# Patient Record
Sex: Male | Born: 1965 | Race: White | Hispanic: No | Marital: Married | Smoking: Current every day smoker
Health system: Southern US, Community
[De-identification: ages and names within clinical notes are randomized; demographics above are authoritative.]

## PROBLEM LIST (undated history)

## (undated) DIAGNOSIS — I10 Essential (primary) hypertension: Secondary | ICD-10-CM

## (undated) DIAGNOSIS — I509 Heart failure, unspecified: Secondary | ICD-10-CM

## (undated) DIAGNOSIS — N289 Disorder of kidney and ureter, unspecified: Secondary | ICD-10-CM

## (undated) HISTORY — PX: TONSILLECTOMY: SUR1361

## (undated) HISTORY — DX: Heart failure, unspecified: I50.9

## (undated) HISTORY — DX: Essential (primary) hypertension: I10

---

## 1998-05-25 ENCOUNTER — Encounter: Admission: RE | Admit: 1998-05-25 | Discharge: 1998-05-25 | Payer: Self-pay | Admitting: Family Medicine

## 1998-05-30 ENCOUNTER — Encounter: Admission: RE | Admit: 1998-05-30 | Discharge: 1998-05-30 | Payer: Self-pay | Admitting: Family Medicine

## 2009-09-16 ENCOUNTER — Emergency Department (HOSPITAL_COMMUNITY): Admission: EM | Admit: 2009-09-16 | Discharge: 2009-09-17 | Payer: Self-pay | Admitting: Emergency Medicine

## 2011-03-29 LAB — BASIC METABOLIC PANEL
CO2: 27 mEq/L (ref 19–32)
Calcium: 9.7 mg/dL (ref 8.4–10.5)
Chloride: 104 mEq/L (ref 96–112)
Creatinine, Ser: 1.08 mg/dL (ref 0.4–1.5)
GFR calc Af Amer: >60 mL/min (ref >60)
Sodium: 141 mEq/L (ref 135–145)

## 2011-03-29 LAB — URINALYSIS, ROUTINE W REFLEX MICROSCOPIC
Glucose, UA: NEGATIVE mg/dL
Specific Gravity, Urine: 1.028 (ref 1.005–1.030)

## 2011-03-29 LAB — DIFFERENTIAL
Lymphs Abs: 1.7 10*3/uL (ref 0.7–4.0)
Monocytes Absolute: 0.7 10*3/uL (ref 0.1–1.0)
Monocytes Relative: 5 % (ref 3–12)
Neutro Abs: 12.5 10*3/uL — ABNORMAL HIGH (ref 1.7–7.7)
Neutrophils Relative %: 82 % — ABNORMAL HIGH (ref 43–77)

## 2011-03-29 LAB — CBC
Hemoglobin: 16.6 g/dL (ref 13.0–17.0)
MCV: 91.5 fL (ref 78.0–100.0)
RBC: 5.26 MIL/uL (ref 4.22–5.81)
WBC: 15.1 10*3/uL — ABNORMAL HIGH (ref 4.0–10.5)

## 2011-03-29 LAB — URINE MICROSCOPIC-ADD ON

## 2013-02-07 ENCOUNTER — Emergency Department (HOSPITAL_COMMUNITY)
Admission: EM | Admit: 2013-02-07 | Discharge: 2013-02-07 | Disposition: A | Discharge status: Home / Self Care | Payer: Self-pay | Attending: Emergency Medicine | Admitting: Emergency Medicine

## 2013-02-07 ENCOUNTER — Emergency Department (HOSPITAL_COMMUNITY): Payer: Self-pay

## 2013-02-07 ENCOUNTER — Encounter (HOSPITAL_COMMUNITY): Payer: Self-pay | Admitting: *Deleted

## 2013-02-07 DIAGNOSIS — F17200 Nicotine dependence, unspecified, uncomplicated: Secondary | ICD-10-CM | POA: Insufficient documentation

## 2013-02-07 DIAGNOSIS — N200 Calculus of kidney: Secondary | ICD-10-CM | POA: Insufficient documentation

## 2013-02-07 HISTORY — DX: Disorder of kidney and ureter, unspecified: N28.9

## 2013-02-07 LAB — CBC WITH DIFFERENTIAL/PLATELET
Basophils Absolute: 0.1 10*3/uL (ref 0.0–0.1)
Basophils Relative: 1 % (ref 0–1)
Eosinophils Absolute: 0.3 10*3/uL (ref 0.0–0.7)
Hemoglobin: 15.5 g/dL (ref 13.0–17.0)
MCH: 30.6 pg (ref 26.0–34.0)
MCHC: 34.4 g/dL (ref 30.0–36.0)
Monocytes Relative: 8 % (ref 3–12)
Neutrophils Relative %: 74 % (ref 43–77)
Platelets: 232 10*3/uL (ref 150–400)
RDW: 13.2 % (ref 11.5–15.5)

## 2013-02-07 LAB — BASIC METABOLIC PANEL
BUN: 10 mg/dL (ref 6–23)
GFR calc Af Amer: >90 mL/min (ref >90)
GFR calc non Af Amer: 79 mL/min — ABNORMAL LOW (ref >90)
Potassium: 3.8 mEq/L (ref 3.5–5.1)

## 2013-02-07 LAB — URINALYSIS, ROUTINE W REFLEX MICROSCOPIC
Bilirubin Urine: NEGATIVE
Ketones, ur: NEGATIVE mg/dL
Nitrite: NEGATIVE
Specific Gravity, Urine: 1.007 (ref 1.005–1.030)
Urobilinogen, UA: 0.2 mg/dL (ref 0.0–1.0)

## 2013-02-07 MED ORDER — SODIUM CHLORIDE 0.9 % IV BOLUS (SEPSIS)
1000.0000 mL | Freq: Once | INTRAVENOUS | Status: AC
  Administered 2013-02-07: 1000 mL via INTRAVENOUS

## 2013-02-07 MED ORDER — OXYCODONE-ACETAMINOPHEN 5-325 MG PO TABS: 1.0000 | ORAL_TABLET | ORAL | Status: DC | PRN

## 2013-02-07 MED ORDER — FENTANYL CITRATE 0.05 MG/ML IJ SOLN: 50.0000 ug | Freq: Once | INTRAMUSCULAR | Status: DC

## 2013-02-07 MED ORDER — KETOROLAC TROMETHAMINE 30 MG/ML IJ SOLN
30.0000 mg | Freq: Once | INTRAMUSCULAR | Status: AC
  Administered 2013-02-07: 30 mg via INTRAVENOUS
  Filled 2013-02-07: qty 1

## 2013-02-07 MED ORDER — IBUPROFEN 600 MG PO TABS: 600.0000 mg | ORAL_TABLET | Freq: Four times a day (QID) | ORAL | Status: DC | PRN

## 2013-02-07 MED ORDER — TAMSULOSIN HCL 0.4 MG PO CAPS: 0.4000 mg | ORAL_CAPSULE | Freq: Every day | ORAL | Status: DC

## 2013-02-07 MED ORDER — HYDROMORPHONE HCL PF 1 MG/ML IJ SOLN
1.0000 mg | Freq: Once | INTRAMUSCULAR | Status: AC
  Administered 2013-02-07: 1 mg via INTRAVENOUS
  Filled 2013-02-07: qty 1

## 2013-02-07 NOTE — ED Notes (Signed)
Discharge instructions reviewed w/ pt., verbalizes understanding. Three prescriptions provided at discharge. Urine strainer, urinal and specimen cup provided. As well.

## 2013-02-07 NOTE — ED Provider Notes (Signed)
Reginald Hernandez is a 47 y.o. male who was signed out at shift change to me. Pt with acute onset of LLQ pain onset 2:30 am. Pt does have hx of kidney stones. His lab work was unremarkable. Large hgb in urine. CT abd pelvis was ordered and pending.   Results for orders placed during the hospital encounter of 02/07/13  CBC WITH DIFFERENTIAL      Result Value Range   WBC 11.7 (*) 4.0 - 10.5 K/uL   RBC 5.06  4.22 - 5.81 MIL/uL   Hemoglobin 15.5  13.0 - 17.0 g/dL   HCT 16.1  09.6 - 04.5 %   MCV 88.9  78.0 - 100.0 fL   MCH 30.6  26.0 - 34.0 pg   MCHC 34.4  30.0 - 36.0 g/dL   RDW 40.9  81.1 - 91.4 %   Platelets 232  150 - 400 K/uL   Neutrophils Relative 74  43 - 77 %   Neutro Abs 8.6 (*) 1.7 - 7.7 K/uL   Lymphocytes Relative 15  12 - 46 %   Lymphs Abs 1.8  0.7 - 4.0 K/uL   Monocytes Relative 8  3 - 12 %   Monocytes Absolute 1.0  0.1 - 1.0 K/uL   Eosinophils Relative 2  0 - 5 %   Eosinophils Absolute 0.3  0.0 - 0.7 K/uL   Basophils Relative 1  0 - 1 %   Basophils Absolute 0.1  0.0 - 0.1 K/uL  BASIC METABOLIC PANEL      Result Value Range   Sodium 135  135 - 145 mEq/L   Potassium 3.8  3.5 - 5.1 mEq/L   Chloride 98  96 - 112 mEq/L   CO2 27  19 - 32 mEq/L   Glucose, Bld 137 (*) 70 - 99 mg/dL   BUN 10  6 - 23 mg/dL   Creatinine, Ser 7.82  0.50 - 1.35 mg/dL   Calcium 9.2  8.4 - 95.6 mg/dL   GFR calc non Af Amer 79 (*) >90 mL/min   GFR calc Af Amer >90  >90 mL/min  URINALYSIS, ROUTINE W REFLEX MICROSCOPIC      Result Value Range   Color, Urine YELLOW  YELLOW   APPearance CLEAR  CLEAR   Specific Gravity, Urine 1.007  1.005 - 1.030   pH 7.0  5.0 - 8.0   Glucose, UA NEGATIVE  NEGATIVE mg/dL   Hgb urine dipstick LARGE (*) NEGATIVE   Bilirubin Urine NEGATIVE  NEGATIVE   Ketones, ur NEGATIVE  NEGATIVE mg/dL   Protein, ur NEGATIVE  NEGATIVE mg/dL   Urobilinogen, UA 0.2  0.0 - 1.0 mg/dL   Nitrite NEGATIVE  NEGATIVE   Leukocytes, UA NEGATIVE  NEGATIVE  URINE MICROSCOPIC-ADD ON      Result  Value Range   RBC / HPF 11-20  <3 RBC/hpf   Ct Abdomen Pelvis Wo Contrast  02/07/2013  *RADIOLOGY REPORT*  Clinical Data: 47 year old male with left-sided abdominal and pelvic pain with hematuria.  CT ABDOMEN AND PELVIS WITHOUT CONTRAST  Technique:  Multidetector CT imaging of the abdomen and pelvis was performed following the standard protocol without intravenous contrast.  Comparison: 09/16/2009 CT  Findings: Dependent atelectasis is noted.  A 3 mm mid left ureteral calculus causes mild left hydronephrosis. No other renal abnormalities are identified.  No other urinary calculi are present.  The liver, spleen, adrenal glands, gallbladder and pancreas are unremarkable.  Please note that parenchymal abnormalities may be missed as intravenous  contrast was not administered.  No free fluid, enlarged lymph nodes, biliary dilation or abdominal aortic aneurysm identified.  The bowel, appendix and bladder are unremarkable.  No acute or suspicious bony abnormalities are identified.  IMPRESSION: 3 mm mid left ureteral calculus causing mild left hydroureteronephrosis.   Original Report Authenticated By: Harmon Pier, M.D.    7:40 AM PT reassessed. Pain is minimal. States he is feeling much better. His CT showed 3mm mid left ureteral calculus with mild left hydroutreteronephrosis. Given a dose of toradol. Will d/c home with pain medications, flomax, follow up with urology. Pt agrees with the plan. He is in no distress. Stable for discharge. VS are normal.   Filed Vitals:   02/07/13 0359  BP: 167/99  Pulse: 75  Temp: 97.6 F (36.4 C)  Resp: [address], [place] 02/07/13 [phone]

## 2013-02-07 NOTE — ED Provider Notes (Signed)
History     CSN: [id number]  Arrival date & time 02/07/13  4098   First MD Initiated Contact with Patient 02/07/13 0355      Chief Complaint  Patient presents with  . Abdominal Pain    (Consider location/radiation/quality/duration/timing/severity/associated sxs/prior treatment) HPI  She presents to the emergency department by EMS for acute onset of left lower quadrant abdominal pain that started while sitting up watching TV. Pain does not radiate anywhere. He denies ever having abdominal pains in the past or hx of abdominal surgeries. When the pain began he broke out into a cold sweat. He says his pain started as a 10/10 but is not a 9/10.  He had a CT scan in 2010 that showed a kidney stone. He did not try any pain medications at home.  Does not have a GI doctor or plans to see one soon. He denies having n,v,d, bloody stool, dysuria, weakness, fatigue, recent illness. Denies any significant PMH.     Past Medical History  Diagnosis Date  . Renal disorder     kidney stones    History reviewed. No pertinent past surgical history.  No family history on file.  History  Substance Use Topics  . Smoking status: Current Every Day Smoker -- 0.50 packs/day  . Smokeless tobacco: Not on file  . Alcohol Use: No      Review of Systems  Review of Systems  Gen: no weight loss, fevers, chills, night sweats  Eyes: no discharge or drainage, no occular pain or visual changes  Nose: no epistaxis or rhinorrhea  Mouth: no dental pain, no sore throat  Neck: no neck pain  Lungs:No wheezing, coughing or hemoptysis CV: no chest pain, palpitations, dependent edema or orthopnea  Abd: + abdominal pain, no nausea, vomiting  GU: no dysuria or gross hematuria  MSK:  No abnormalities  Neuro: no headache, no focal neurologic deficits  Skin: no abnormalities Psyche: negative.   Allergies  Review of patient's allergies indicates no known allergies.  Home Medications   Current Outpatient Rx   Name  Route  Sig  Dispense  Refill  . ibuprofen (ADVIL,MOTRIN) 600 MG tablet   Oral   Take 1 tablet (600 mg total) by mouth every 6 (six) hours as needed for pain.   30 tablet   0   . oxyCODONE-acetaminophen (PERCOCET) 5-325 MG per tablet   Oral   Take 1 tablet by mouth every 4 (four) hours as needed for pain.   20 tablet   0   . Tamsulosin HCl (FLOMAX) 0.4 MG CAPS   Oral   Take 1 capsule (0.4 mg total) by mouth daily.   10 capsule   0     BP 123/88  Pulse 86  Temp(Src) 97.8 F (36.6 C) (Oral)  Resp 16  SpO2 96%  Physical Exam  Nursing note and vitals reviewed. Constitutional: He appears well-developed and well-nourished. No distress.  HENT:  Head: Normocephalic and atraumatic.  Eyes: Pupils are equal, round, and reactive to light.  Neck: Normal range of motion. Neck supple.  Cardiovascular: Normal rate and regular rhythm.   Pulmonary/Chest: Effort normal.  Abdominal: Soft. He exhibits no shifting dullness, no distension, no pulsatile liver and no fluid wave. There is tenderness in the left lower quadrant. There is guarding. There is no rigidity, no tenderness at McBurney's point and negative Murphy's sign.    Neurological: He is alert.  Skin: Skin is warm. He is diaphoretic.    ED Course  Procedures (including critical care time)  Labs Reviewed  CBC WITH DIFFERENTIAL - Abnormal; Notable for the following:    WBC 11.7 (*)    Neutro Abs 8.6 (*)    All other components within normal limits  BASIC METABOLIC PANEL - Abnormal; Notable for the following:    Glucose, Bld 137 (*)    GFR calc non Af Amer 79 (*)    All other components within normal limits  URINALYSIS, ROUTINE W REFLEX MICROSCOPIC - Abnormal; Notable for the following:    Hgb urine dipstick LARGE (*)    All other components within normal limits  URINE MICROSCOPIC-ADD ON   Ct Abdomen Pelvis Wo Contrast  02/07/2013  *RADIOLOGY REPORT*  Clinical Data: 47 year old male with left-sided abdominal and  pelvic pain with hematuria.  CT ABDOMEN AND PELVIS WITHOUT CONTRAST  Technique:  Multidetector CT imaging of the abdomen and pelvis was performed following the standard protocol without intravenous contrast.  Comparison: 09/16/2009 CT  Findings: Dependent atelectasis is noted.  A 3 mm mid left ureteral calculus causes mild left hydronephrosis. No other renal abnormalities are identified.  No other urinary calculi are present.  The liver, spleen, adrenal glands, gallbladder and pancreas are unremarkable.  Please note that parenchymal abnormalities may be missed as intravenous contrast was not administered.  No free fluid, enlarged lymph nodes, biliary dilation or abdominal aortic aneurysm identified.  The bowel, appendix and bladder are unremarkable.  No acute or suspicious bony abnormalities are identified.  IMPRESSION: 3 mm mid left ureteral calculus causing mild left hydroureteronephrosis.   Original Report Authenticated By: Harmon Pier, M.D.      1. Kidney stone on left side       MDM  Basic labs and urine ordered. Not classic kidney stone presentation. Will get urine to see if we need CT w/wo contrast.     End of shift hand off to [employer], PA-C. Waiting CT wo contrast results    Dorthula Matas, PA 02/09/13 [phone]

## 2013-02-07 NOTE — ED Notes (Signed)
Patient transported to CT 

## 2013-02-07 NOTE — ED Provider Notes (Signed)
Medical screening examination/treatment/procedure(s) were performed by non-physician practitioner and as supervising physician I was immediately available for consultation/collaboration.  Maysun Meditz M Chalyn Amescua, MD 02/07/13 2113 

## 2013-02-07 NOTE — ED Notes (Signed)
Pt to ER via EMS for complaints of LLQ pain that began suddenly around 2:30am; pt reports that he was sitting up watching TV and the pain began suddenly; pt describes pain as throbbing & constant; tender to LLQ upon palpation; denies N/V/D; pt states that he has a history of kidney but that this does not feel like kidney stone pain.

## 2013-02-09 NOTE — ED Provider Notes (Signed)
Medical screening examination/treatment/procedure(s) were performed by non-physician practitioner and as supervising physician I was immediately available for consultation/collaboration.  Tiffany Calmes M Khaalid Lefkowitz, MD 02/09/13 0552 

## 2014-10-24 IMAGING — CT CT ABD-PELV W/O CM
1 series · 15 of 22 positions shown, 20 images · non-contrast
Comparison: 09/16/2009 CT

CLINICAL DATA: 46-year-old male with left-sided abdominal and
pelvic pain with hematuria.

CT ABDOMEN AND PELVIS WITHOUT CONTRAST
TECHNIQUE: Multidetector CT imaging of the abdomen and pelvis was
performed following the standard protocol without intravenous
contrast.

[Series 4: lung · axial · 0.80mm/px · z∈[-102,-6]mm · 15 of 22 slices shown, 20 images]
[im 2/22  soft-tissue]
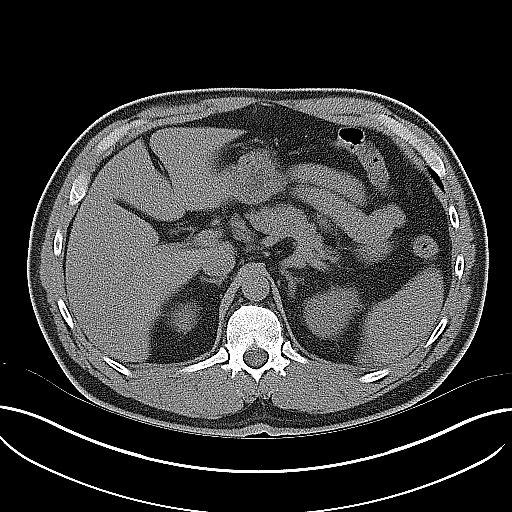
[im 2/22  bone]
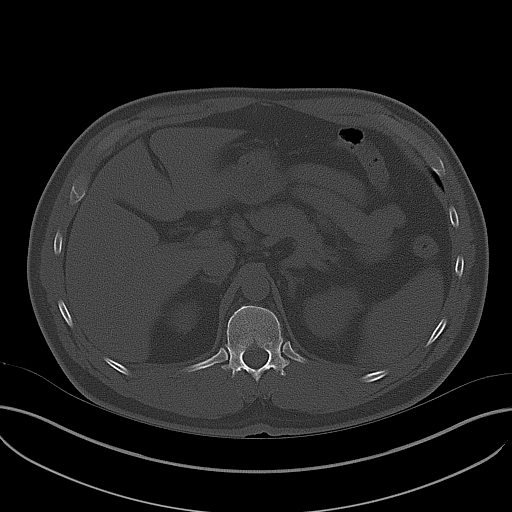
[im 4/22  soft-tissue]
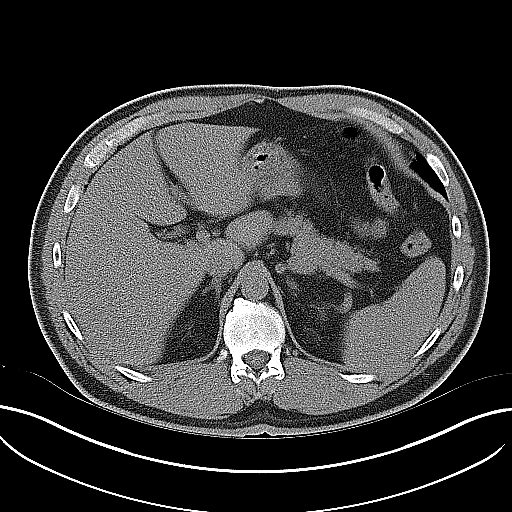
[im 5/22  soft-tissue]
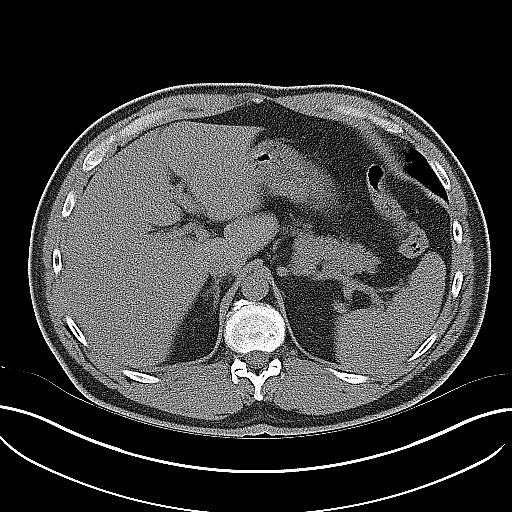
[im 7/22  soft-tissue]
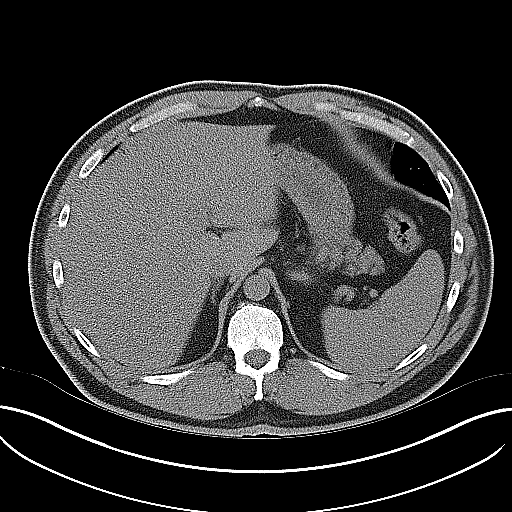
[im 8/22  soft-tissue]
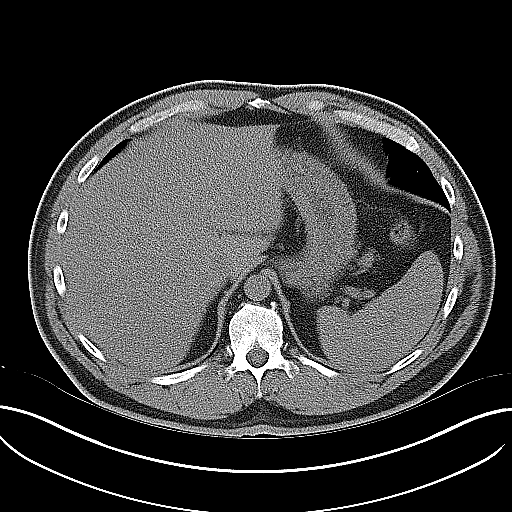
[im 9/22  soft-tissue]
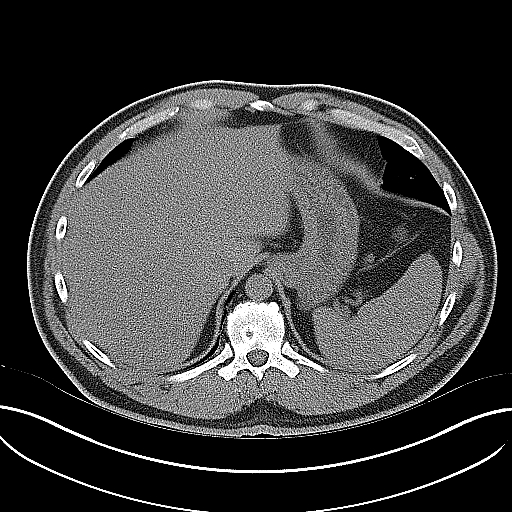
[im 11/22  soft-tissue]
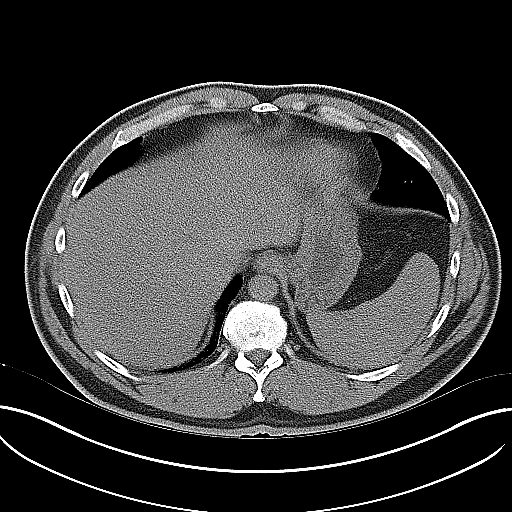
[im 12/22  soft-tissue]
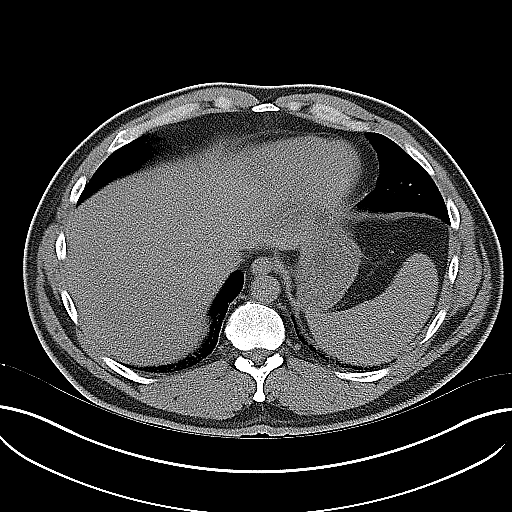
[im 14/22  soft-tissue]
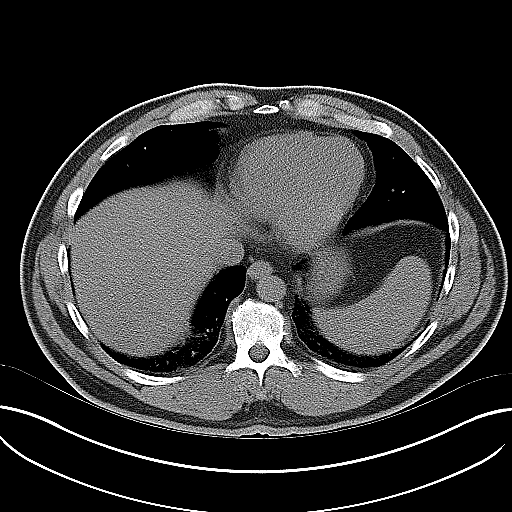
[im 14/22  bone]
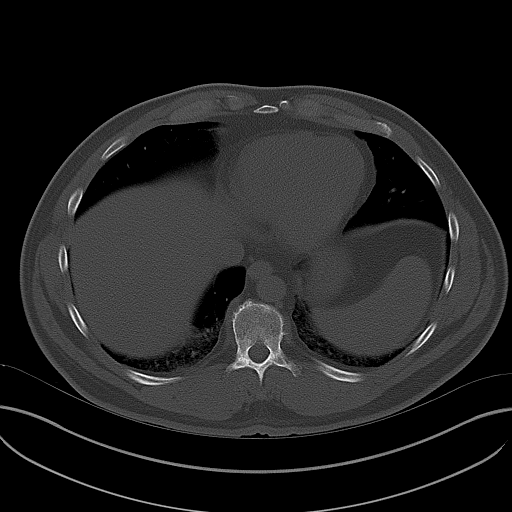
[im 15/22  soft-tissue]
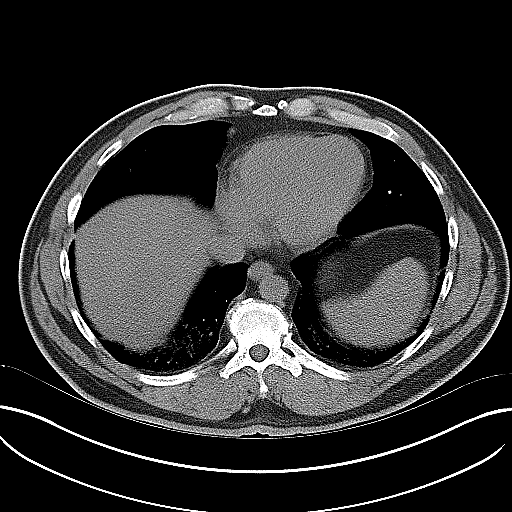
[im 17/22  soft-tissue]
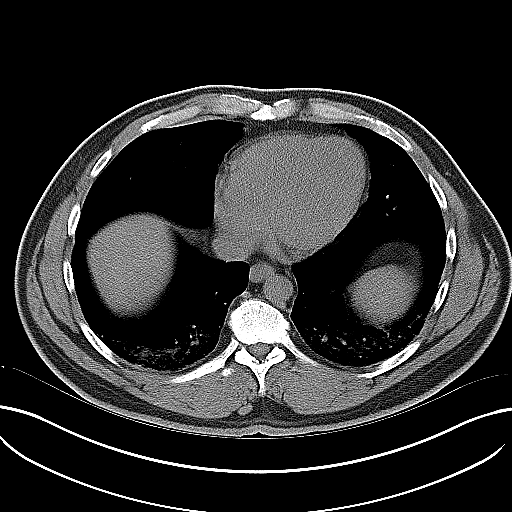
[im 18/22  soft-tissue]
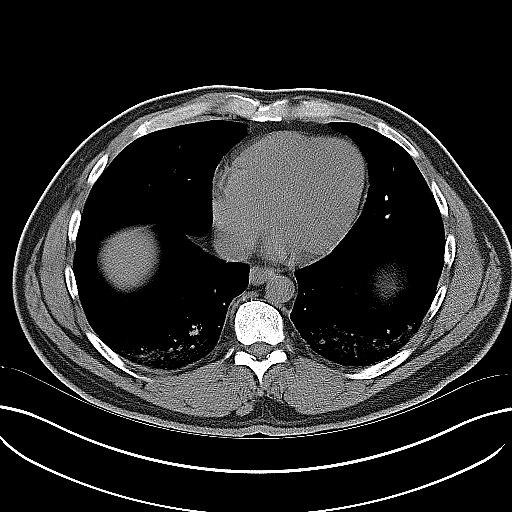
[im 18/22  lung]
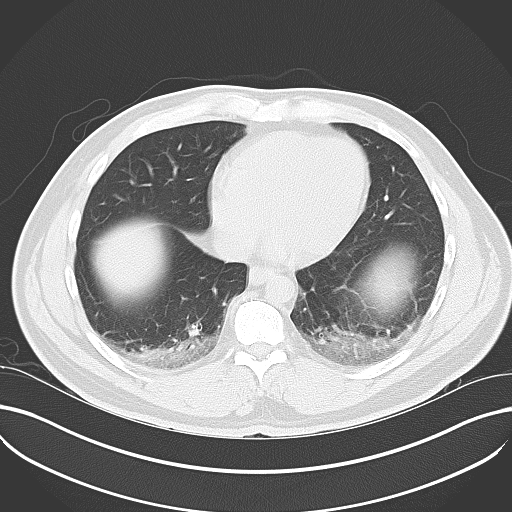
[im 19/22  soft-tissue]
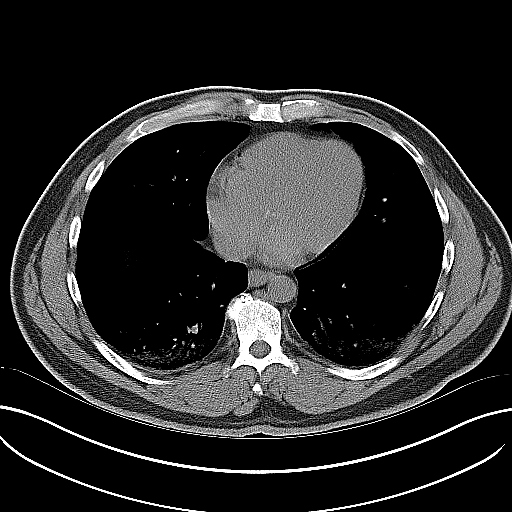
[im 19/22  lung]
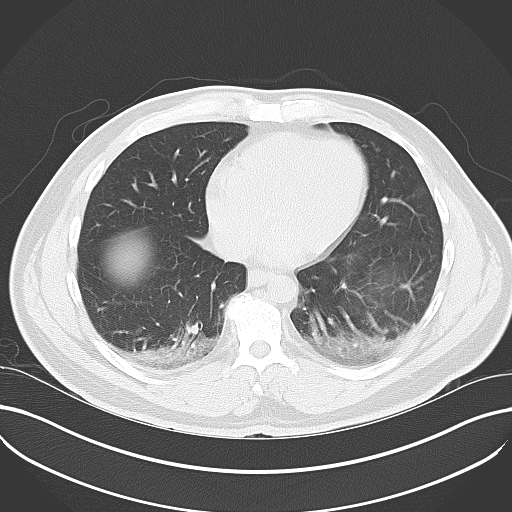
[im 20/22  lung]
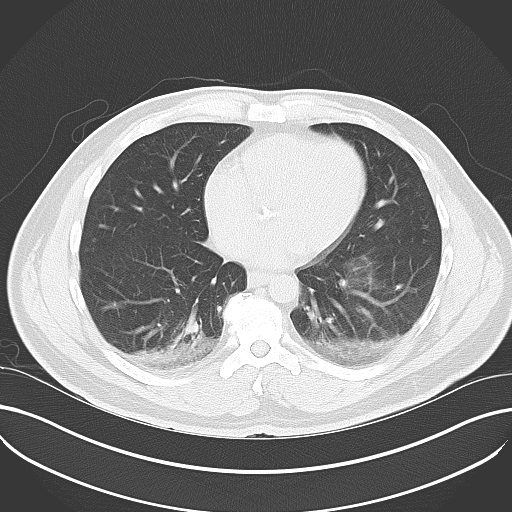
[im 21/22  soft-tissue]
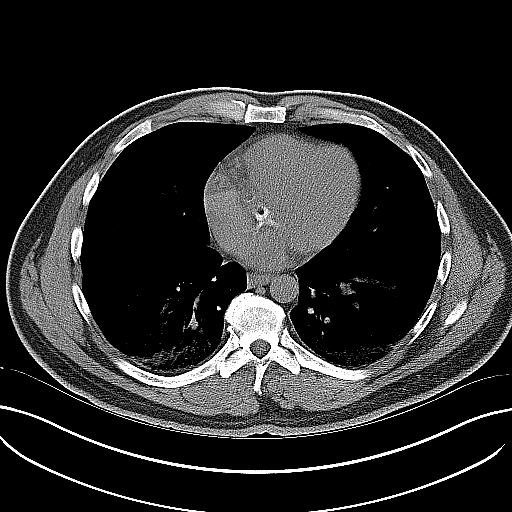
[im 21/22  lung]
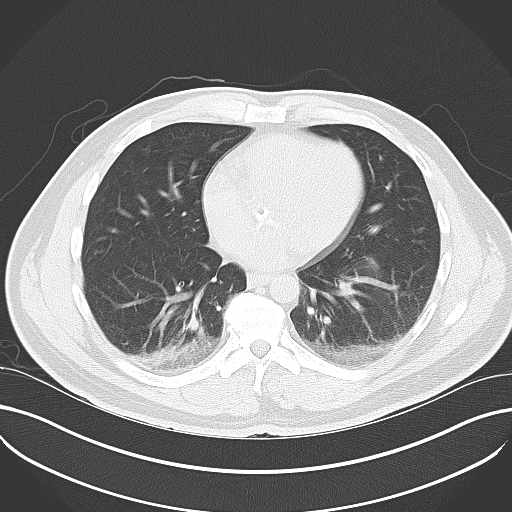

[15 of 22 positions shown; findings below may reference images not displayed]

FINDINGS: Dependent atelectasis is noted.

A 3 mm mid left ureteral calculus causes mild left hydronephrosis.
No other renal abnormalities are identified.  No other urinary
calculi are present.

The liver, spleen, adrenal glands, gallbladder and pancreas are
unremarkable.

Please note that parenchymal abnormalities may be missed as
intravenous contrast was not administered.

No free fluid, enlarged lymph nodes, biliary dilation or abdominal
aortic aneurysm identified.

The bowel, appendix and bladder are unremarkable.

No acute or suspicious bony abnormalities are identified.
IMPRESSION: 3 mm mid left ureteral calculus causing mild left
hydroureteronephrosis.

## 2014-11-18 ENCOUNTER — Emergency Department (HOSPITAL_COMMUNITY)
Admission: EM | Admit: 2014-11-18 | Discharge: 2014-11-18 | Disposition: A | Discharge status: Home / Self Care | Payer: Self-pay | Attending: Emergency Medicine | Admitting: Emergency Medicine

## 2014-11-18 ENCOUNTER — Encounter (HOSPITAL_COMMUNITY): Payer: Self-pay | Admitting: *Deleted

## 2014-11-18 DIAGNOSIS — R0602 Shortness of breath: Secondary | ICD-10-CM | POA: Insufficient documentation

## 2014-11-18 DIAGNOSIS — Z720 Tobacco use: Secondary | ICD-10-CM | POA: Insufficient documentation

## 2014-11-18 DIAGNOSIS — Z79899 Other long term (current) drug therapy: Secondary | ICD-10-CM | POA: Insufficient documentation

## 2014-11-18 DIAGNOSIS — Y9389 Activity, other specified: Secondary | ICD-10-CM | POA: Insufficient documentation

## 2014-11-18 DIAGNOSIS — X58XXXA Exposure to other specified factors, initial encounter: Secondary | ICD-10-CM | POA: Insufficient documentation

## 2014-11-18 DIAGNOSIS — Z87442 Personal history of urinary calculi: Secondary | ICD-10-CM | POA: Insufficient documentation

## 2014-11-18 DIAGNOSIS — Y998 Other external cause status: Secondary | ICD-10-CM | POA: Insufficient documentation

## 2014-11-18 DIAGNOSIS — Y9289 Other specified places as the place of occurrence of the external cause: Secondary | ICD-10-CM | POA: Insufficient documentation

## 2014-11-18 DIAGNOSIS — L299 Pruritus, unspecified: Secondary | ICD-10-CM | POA: Insufficient documentation

## 2014-11-18 DIAGNOSIS — T782XXA Anaphylactic shock, unspecified, initial encounter: Secondary | ICD-10-CM | POA: Insufficient documentation

## 2014-11-18 LAB — I-STAT TROPONIN, ED: Troponin i, poc: 0 ng/mL (ref 0.00–0.08)

## 2014-11-18 LAB — CBC
HEMATOCRIT: 43.6 % (ref 39.0–52.0)
HEMOGLOBIN: 14.5 g/dL (ref 13.0–17.0)
MCH: 30.2 pg (ref 26.0–34.0)
MCHC: 33.3 g/dL (ref 30.0–36.0)
MCV: 90.8 fL (ref 78.0–100.0)
Platelets: 275 10*3/uL (ref 150–400)
RBC: 4.8 MIL/uL (ref 4.22–5.81)
RDW: 13 % (ref 11.5–15.5)
WBC: 9 10*3/uL (ref 4.0–10.5)

## 2014-11-18 LAB — BASIC METABOLIC PANEL
Anion gap: 14 (ref 5–15)
BUN: 13 mg/dL (ref 6–23)
CHLORIDE: 104 meq/L (ref 96–112)
CO2: 24 meq/L (ref 19–32)
Calcium: 8.8 mg/dL (ref 8.4–10.5)
Creatinine, Ser: 0.88 mg/dL (ref 0.50–1.35)
GFR calc Af Amer: >90 mL/min (ref >90)
GFR calc non Af Amer: >90 mL/min (ref >90)
GLUCOSE: 120 mg/dL — AB (ref 70–99)
POTASSIUM: 4.4 meq/L (ref 3.7–5.3)
SODIUM: 142 meq/L (ref 137–147)

## 2014-11-18 MED ORDER — METHYLPREDNISOLONE SODIUM SUCC 125 MG IJ SOLR
125.0000 mg | Freq: Once | INTRAMUSCULAR | Status: AC
  Administered 2014-11-18: 125 mg via INTRAVENOUS
  Filled 2014-11-18: qty 2

## 2014-11-18 MED ORDER — FAMOTIDINE 20 MG PO TABS
20.0000 mg | ORAL_TABLET | Freq: Once | ORAL | Status: AC
  Administered 2014-11-18: 20 mg via ORAL
  Filled 2014-11-18: qty 1

## 2014-11-18 MED ORDER — PREDNISONE 20 MG PO TABS: 60.0000 mg | ORAL_TABLET | Freq: Every day | ORAL | Status: DC

## 2014-11-18 MED ORDER — DIPHENHYDRAMINE HCL 50 MG/ML IJ SOLN
25.0000 mg | Freq: Once | INTRAMUSCULAR | Status: AC
  Administered 2014-11-18: 25 mg via INTRAVENOUS
  Filled 2014-11-18: qty 1

## 2014-11-18 MED ORDER — SODIUM CHLORIDE 0.9 % IV BOLUS (SEPSIS)
1000.0000 mL | Freq: Once | INTRAVENOUS | Status: AC
  Administered 2014-11-18: 1000 mL via INTRAVENOUS

## 2014-11-18 MED ORDER — EPINEPHRINE 0.3 MG/0.3ML IJ SOAJ: 0.3000 mg | Freq: Once | INTRAMUSCULAR | Status: DC

## 2014-11-18 MED ORDER — DIPHENHYDRAMINE HCL 25 MG PO TABS: 50.0000 mg | ORAL_TABLET | Freq: Four times a day (QID) | ORAL | Status: DC

## 2014-11-18 MED ORDER — FAMOTIDINE 20 MG PO TABS: 20.0000 mg | ORAL_TABLET | Freq: Two times a day (BID) | ORAL | Status: DC

## 2014-11-18 MED ORDER — EPINEPHRINE 0.3 MG/0.3ML IJ SOAJ
0.3000 mg | Freq: Once | INTRAMUSCULAR | Status: AC
  Administered 2014-11-18: 0.3 mg via INTRAMUSCULAR

## 2014-11-18 MED ORDER — EPINEPHRINE 0.3 MG/0.3ML IJ SOAJ
INTRAMUSCULAR | Status: AC
  Filled 2014-11-18: qty 0.3

## 2014-11-18 NOTE — Discharge Instructions (Signed)
Anaphylactic Reaction °An anaphylactic reaction is a sudden, severe allergic reaction that involves the whole body. It can be life threatening. A hospital stay is often required. People with asthma, eczema, or hay fever are slightly more likely to have an anaphylactic reaction. °CAUSES  °An anaphylactic reaction may be caused by anything to which you are allergic. After being exposed to the allergic substance, your immune system becomes sensitized to it. When you are exposed to that allergic substance again, an allergic reaction can occur. Common causes of an anaphylactic reaction include: °· Medicines. °· Foods, especially peanuts, wheat, shellfish, milk, and eggs. °· Insect bites or stings. °· Blood products. °· Chemicals, such as dyes, latex, and contrast material used for imaging tests. °SYMPTOMS  °When an allergic reaction occurs, the body releases histamine and other substances. These substances cause symptoms such as tightening of the airway. Symptoms often develop within seconds or minutes of exposure. Symptoms may include: °· Skin rash or hives. °· Itching. °· Chest tightness. °· Swelling of the eyes, tongue, or lips. °· Trouble breathing or swallowing. °· Lightheadedness or fainting. °· Anxiety or confusion. °· Stomach pains, vomiting, or diarrhea. °· Nasal congestion. °· A fast or irregular heartbeat (palpitations). °DIAGNOSIS  °Diagnosis is based on your history of recent exposure to allergic substances, your symptoms, and a physical exam. Your caregiver may also perform blood or urine tests to confirm the diagnosis. °TREATMENT  °Epinephrine medicine is the main treatment for an anaphylactic reaction. Other medicines that may be used for treatment include antihistamines, steroids, and albuterol. In severe cases, fluids and medicine to support blood pressure may be given through an intravenous line (IV). Even if you improve after treatment, you need to be observed to make sure your condition does not get  worse. This may require a stay in the hospital. °HOME CARE INSTRUCTIONS  °· Wear a medical alert bracelet or necklace stating your allergy. °· You and your family must learn how to use an anaphylaxis kit or give an epinephrine injection to temporarily treat an emergency allergic reaction. Always carry your epinephrine injection or anaphylaxis kit with you. This can be lifesaving if you have a severe reaction. °· Do not drive or perform tasks after treatment until the medicines used to treat your reaction have worn off, or until your caregiver says it is okay. °· If you have hives or a rash: °¨ Take medicines as directed by your caregiver. °¨ You may use an over-the-counter antihistamine (diphenhydramine) as needed. °¨ Apply cold compresses to the skin or take baths in cool water. Avoid hot baths or showers. °SEEK MEDICAL CARE IF:  °· You develop symptoms of an allergic reaction to a new substance. Symptoms may start right away or minutes later. °· You develop a rash, hives, or itching. °· You develop new symptoms. °SEEK IMMEDIATE MEDICAL CARE IF:  °· You have swelling of the mouth, difficulty breathing, or wheezing. °· You have a tight feeling in your chest or throat. °· You develop hives, swelling, or itching all over your body. °· You develop severe vomiting or diarrhea. °· You feel faint or pass out. °This is an emergency. Use your epinephrine injection or anaphylaxis kit as you have been instructed. Call your local emergency services (911 in U.S[place] Even if you improve after the injection, you need to be examined at a hospital emergency department. °MAKE SURE YOU:  °· Understand these instructions. °· Will watch your condition. °· Will get help right away if you are not   doing well or get worse. Document Released: 12/09/2005 Document Revised: 12/14/2013 Document Reviewed: 03/11/2012 [facility] Patient Information 2015 [place], [place]. This information is not intended to replace advice given to you by your health  care provider. Make sure you discuss any questions you have with your health care provider.   Emergency Department Resource Guide 1) Find a Doctor and Pay Out of Pocket Although you won't have to find out who is covered by your insurance plan, it is a good idea to ask around and get recommendations. You will then need to call the office and see if the doctor you have chosen will accept you as a new patient and what types of options they offer for patients who are self-pay. Some doctors offer discounts or will set up payment plans for their patients who do not have insurance, but you will need to ask so you aren't surprised when you get to your appointment.  2) Contact Your Local Health Department Not all health departments have doctors that can see patients for sick visits, but many do, so it is worth a call to see if yours does. If you don't know where your local health department is, you can check in your phone book. The CDC also has a tool to help you locate your state's health department, and many state websites also have listings of all of their local health departments.  3) Find a [facility] If your illness is not likely to be very severe or complicated, you may want to try a walk in clinic. These are popping up all over the country in pharmacies, drugstores, and shopping centers. They're usually staffed by nurse practitioners or physician assistants that have been trained to treat common illnesses and complaints. They're usually fairly quick and inexpensive. However, if you have serious medical issues or chronic medical problems, these are probably not your best option.  No Primary Care Doctor: - Call Health Connect at  [phone] - they can help you locate a primary care doctor that  accepts your insurance, provides certain services, etc. - Physician Referral Service- [phone]  Chronic Pain Problems: Organization         Address  Phone   Notes  [facility]   [phone] Patients need to be referred by their primary care doctor.   Medication Assistance: Organization         Address  Phone   Notes  [facility] Medication [facility] [address] [place], [place] [address] [phone] --Must be a resident of [facility] -- Must have NO insurance coverage whatsoever (no Medicaid/ Medicare, etc.) -- The pt. MUST have a primary care doctor that directs their care regularly and follows them in the community   [facility]ist  [phone]   [employer]  [phone]    Agencies that provide inexpensive medical care: Organization         Address  Phone   Notes  [place]  [phone]   [facility]    [phone]   [facility] [place], [place] [address] [phone]   [place] [address] [place] [phone]   Planned Parenthood    [phone]   [facility]    [phone]   [place] and [address], [place] Phone:  [phone], Fax:  [phone] Hours of Operation:  9  am - 6 pm, M-F.  Also accepts Medicaid/Medicare and self-pay.  [facility] for [address], [address], [place] Phone: [phone], Fax: [phone]. Hours of Operation:  8:30 am - 5:30 pm, M-F.  Also accepts Medicaid and self-pay.  [facility] [place] [address], [place] Phone: [phone]   [place], [place], [place] [phone], Ext. [phone] Mondays & Thursdays: 7-9 AM.  First 15 patients are seen on a first come, first serve basis.    [place] Providers:  Organization         Address  Phone   Notes  [facility] [address], [place] [phone] Also accepts self-pay patients.  [facility] [address], [place]  [phone]   [employer], [address], [place] [phone]   [facility] [address], [place] [phone]   Lucianne Lei [address], [place]   [phone] Only accepts [place] Access [place] patients after they have their name applied to their card.   Self-Pay (no insurance) in [facility]:  Organization         Address  Phone   Notes  Sickle Cell Patients, [facility] [place] [phone]   [facility] [place] [phone]   [facility] [place]  [place], [place], [place] [phone]   [facility]/Dr. Osei-Bonsu  [address] [place] or [address], [place] [phone] Phone number for both [place] and [place] locations is the same.  [facility] [address] [place] [phone]   [facility] [address] [place] or [address] [phone]   [facility] [address], [place] [phone], phone; [phone], fax Sees patients 1st and 3rd Saturday of every month.  Must not qualify for public or private insurance (i.e. Medicaid, Medicare, [place] Health Choice, Veterans' Benefits)  Household income should be no more than 200% of the poverty level The clinic cannot treat you if you are pregnant or think you are pregnant  Sexually transmitted diseases are not treated at the clinic.    Dental Care: Organization         Address  Phone  Notes  [facility] Department of [facility] [place] [phone] Accepts children up to age 28 who are enrolled in [place] or [place]; pregnant women with a Medicaid card; and children who have applied for Medicaid or [place] Health  Choice, but were declined, whose parents can pay a reduced fee at time of service.  [facility] Department of [facility]  [address], [place] [phone] Accepts children up to age 58 who are enrolled in [place] or [place]; pregnant women with a Medicaid card; and children who have applied for Medicaid or [place] Health Choice, but were declined, whose parents can pay a reduced fee at time of service.  [facility] Adult [facility] Access PROGRAM  [place] [phone] Patients are seen by appointment only. Walk-ins are not accepted. [place] will see patients 18 years of  age and older. Monday - Tuesday (8am-5pm) Most Wednesdays (8:30-5pm) $30 per visit, cash only  [facility] Adult Dental Access PROGRAM  [address], [facility] [phone] Patients are seen by appointment only. Walk-ins are not accepted. [place] will see patients 37 years of age and older. One Wednesday Evening (Monthly: Volunteer Based).  $30 per visit, cash only  [place]  [phone] for adults; Children under age 21, call Graduate Pediatric Dentistry at [phone]. Children aged 24-14, please call [phone] to request a pediatric application.  Dental services are provided in all areas of dental care including fillings, crowns and bridges, complete and partial dentures, implants, gum treatment, root canals, and extractions. Preventive care is also provided. Treatment is provided to both adults and children. Patients are selected via a lottery and there is often a waiting list.   [facility] [address] [place]  [phone] [email or web address]   [facility] [address] [place], [place] [phone], Ext. [phone] Second and Fourth Thursday of each month, opens at 6:30 AM; Clinic ends at 9 AM.  Patients are seen on a first-come first-served basis, and a limited number are seen during each  clinic.   [facility]  [address] Hillard Danker [place], [place] [phone]   Eligibility Requirements You must have lived in [place], [place], or [place] counties for at least the last three months.   You cannot be eligible for state or federal sponsored [employer], including [employer], [place], or [employer].   You generally cannot be eligible for healthcare insurance through your employer.    How to apply: Eligibility screenings are held every Tuesday and Wednesday afternoon from 1:00 pm until 4:00 pm. You do not need an appointment for the interview!  [facility] [address], [place], [place]   [place]  [place] Department  [place]  [phone]    Behavioral Health Resources in the Community: Intensive Outpatient Programs Organization         Address  Phone  Notes  [place]. [address] [place], [place] [phone]   [facility] Outpatient [address], [place], [place]   ADS: Alcohol & Drug Svcs [address] [place], [place]   [place] [address],  [place], [place] or [phone]   Substance Abuse Resources Organization         Address  Phone  Notes  Alcohol and Drug Services  [phone]   [place]  [phone]   [facility]   Chinita Pester  [phone]   Residential & Outpatient Substance Abuse Program  [phone]   Psychological Services Organization         Address  Phone  Notes  [facility] [place]  [place]  [phone]   [place] [address], [place] or [phone]    Mobile Crisis Teams Organization         Address  Phone  Notes  [facility], Mobile  Crisis Care Unit  [phone]   [facility]  [address] [place], [place]   [facility] [address] [place] [phone]    Self-Help/Support Groups Organization  Address  Phone             Notes  [place]. of [place] - variety of support groups  [place] Call for more information  Narcotics Anonymous (NA), [facility] [address], [employer] [place]  2 meetings at this location   Special educational needs teacher         Address  Phone  Notes  [facility] [place],    [place]  [phone]   [facility]  [address] [place] [phone], [place], [place]   [place], [place] [phone] Admissions: 8am-3pm M-F  Incentives Substance [place] [address],    [place], [place] [phone]   [facility] [address] [place], [place], [place]   [facility] [address]  [place], [place]   [facility] Programs - Intensive Outpatient [address], [place], [place]   [facility] ([place].) [place].,  [place], [place] [phone] or [phone]   [facility] (RTS) [address] [place], [place] Accepts Medicaid  [facility] [place] [address]  [place] [phone] Substance Abuse/Addiction Treatment   [facility] Organization         Address  Phone  Notes  [facility]  [phone]   Domenic Schwab, PhD [address] [place]lis [place], [place]   [phone] or [phone]   [place], [place] [phone]   [facility] [address], [place], [place] [phone] Insurance/Medicaid/sponsorship through [facility] and Families [address], [address]                                    [place], [place] [phone] [place] [address], [place] [phone]    Dr. Adele Schilder  [phone]   [facility] Dept. 1) [address], [place] 2) [place] 3)  [place] [address], [place] [phone]  [phone]   [place] [phone] or [phone] (After Hours)

## 2014-11-18 NOTE — ED Notes (Signed)
The pt is c/o hives all over his body that started 15 minutes ago  Facial redness and swelling  And he is itching all over his body.  He is having a little difficulty breathing.  No idea waht he is allergic to.  No previous  history

## 2014-11-18 NOTE — ED Provider Notes (Signed)
CSN: [id number]     Arrival date & time 11/18/14  [id number] History  This chart was scribed for Reginald MochaBlair Ayman Brull, MD by Reginald Hernandez, ED Scribe. This patient was seen in room A01C/A01C and the patient's care was started 3:24 AM.    Chief Complaint  Patient presents with  . Allergic Reaction   Patient is a 48 y.o. male presenting with allergic reaction. The history is provided by the patient. No language interpreter was used.  Allergic Reaction Presenting symptoms: difficulty breathing, itching and rash   Difficulty breathing:    Severity:  Mild   Timing:  Constant Itching:    Location:  Full body   Severity:  Moderate   Timing:  Constant Prior allergic episodes:  No prior episodes Relieved by:  None tried Worsened by:  Nothing tried    HPI Comments:  Reginald Hernandez is a 48 y.o. male who presents to the Emergency Department for a possible allergic reaction complaining of diffuse rash that started about 15 minutes PTA. He reports associated mild SOB, and pruritus. He states he feels as though his throat is swelling and his voice is hoarse. He denies a h/o same. No alleviating factors noted.   Past Medical History  Diagnosis Date  . Renal disorder     kidney stones   History reviewed. No pertinent past surgical history. No family history on file. History  Substance Use Topics  . Smoking status: Current Every Day Smoker -- 0.50 packs/day  . Smokeless tobacco: Not on file  . Alcohol Use: No    Review of Systems  Respiratory: Positive for shortness of breath.   Skin: Positive for itching and rash.  All other systems reviewed and are negative.     Allergies  Review of patient's allergies indicates no known allergies.  Home Medications   Prior to Admission medications   Medication Sig Start Date End Date Taking? Authorizing Provider  ibuprofen (ADVIL,MOTRIN) 600 MG tablet Take 1 tablet (600 mg total) by mouth every 6 (six) hours as needed for pain. 02/07/13   Tatyana A  Kirichenko, PA-C  oxyCODONE-acetaminophen (PERCOCET) 5-325 MG per tablet Take 1 tablet by mouth every 4 (four) hours as needed for pain. 02/07/13   Tatyana A Kirichenko, PA-C  Tamsulosin HCl (FLOMAX) 0.4 MG CAPS Take 1 capsule (0.4 mg total) by mouth daily. 02/07/13   Tatyana A Kirichenko, PA-C   There were no vitals taken for this visit. Physical Exam  Constitutional: He is oriented to person, place, and time. He appears well-developed and well-nourished. No distress.  HENT:  Head: Normocephalic and atraumatic.  Mouth/Throat: No oropharyngeal exudate.  Eyes: EOM are normal. Pupils are equal, round, and reactive to light.  Neck: Normal range of motion. Neck supple.  Cardiovascular: Normal rate and regular rhythm.  Exam reveals no friction rub.   No murmur heard. Pulmonary/Chest: Effort normal and breath sounds normal. No stridor. No respiratory distress. He has no wheezes. He has no rales.  Abdominal: He exhibits no distension. There is no tenderness. There is no rebound.  Musculoskeletal: Normal range of motion. He exhibits no edema.  Neurological: He is alert and oriented to person, place, and time.  Skin: Rash (hives, diffuse on truck, backs of arms, neck. Chest is flushed) noted. He is not diaphoretic.  Nursing note and vitals reviewed.   ED Course  Procedures   DIAGNOSTIC STUDIES:  Oxygen Saturation is 100% on RA, normal by my interpretation.    COORDINATION OF CARE:  3:27 AM  Discussed treatment plan with pt at bedside and pt agreed to plan.  Labs Review Labs Reviewed - No data to display  Imaging Review No results found.   EKG Interpretation   Date/Time:  Friday November 18 2014 03:29:38 EST Ventricular Rate:  89 PR Interval:  166 QRS Duration: 80 QT Interval:  384 QTC Calculation: 467 R Axis:   -29 Text Interpretation:  Sinus rhythm Probable left atrial enlargement  Borderline left axis deviation No prior for comparison Confirmed by Gwendolyn Grant   MD, Dawsen Krieger ([id number]) on  11/18/2014 3:59:25 AM      MDM   Final diagnoses:  Anaphylaxis, initial encounter    62M here with allergic reaction concerning for anaphylaxis. Patient had unknown allergen exposure. No hx of allergies requiring epipen previously. Patient here with CP, difficulty breathing sensation, hives with pruritis.  Lungs clear, no wheezing, no stridor, no airway swelling. EpiPen, steroids, H1 blockers, fluids given. Will observe. On re-exam after Epi, states he's feeling mildly better. On re-exam after 3 hours of observation, hives resolved, sleeping comfortably. Given meds. Stable for discharge. I personally performed the services described in this documentation, which was scribed in my presence. The recorded information has been reviewed and is accurate.     Reginald Mocha, MD 11/18/14 [phone]

## 2015-08-14 ENCOUNTER — Emergency Department
Admission: EM | Admit: 2015-08-14 | Discharge: 2015-08-14 | Disposition: A | Discharge status: Home / Self Care | Payer: Self-pay | Attending: Emergency Medicine | Admitting: Emergency Medicine

## 2015-08-14 ENCOUNTER — Encounter: Payer: Self-pay | Admitting: Emergency Medicine

## 2015-08-14 DIAGNOSIS — L500 Allergic urticaria: Secondary | ICD-10-CM | POA: Insufficient documentation

## 2015-08-14 DIAGNOSIS — R42 Dizziness and giddiness: Secondary | ICD-10-CM | POA: Insufficient documentation

## 2015-08-14 DIAGNOSIS — L509 Urticaria, unspecified: Secondary | ICD-10-CM

## 2015-08-14 DIAGNOSIS — Z720 Tobacco use: Secondary | ICD-10-CM | POA: Insufficient documentation

## 2015-08-14 MED ORDER — PREDNISONE 20 MG PO TABS: 60.0000 mg | ORAL_TABLET | Freq: Every day | ORAL | Status: DC

## 2015-08-14 MED ORDER — DIPHENHYDRAMINE HCL 50 MG/ML IJ SOLN
50.0000 mg | Freq: Once | INTRAMUSCULAR | Status: AC
  Administered 2015-08-14: 50 mg via INTRAVENOUS

## 2015-08-14 MED ORDER — DIPHENHYDRAMINE HCL 50 MG/ML IJ SOLN
INTRAMUSCULAR | Status: AC
  Administered 2015-08-14: 50 mg via INTRAVENOUS
  Filled 2015-08-14: qty 1

## 2015-08-14 MED ORDER — FAMOTIDINE IN NACL 20-0.9 MG/50ML-% IV SOLN
20.0000 mg | Freq: Once | INTRAVENOUS | Status: AC
  Administered 2015-08-14: 20 mg via INTRAVENOUS

## 2015-08-14 MED ORDER — METHYLPREDNISOLONE SODIUM SUCC 125 MG IJ SOLR
125.0000 mg | Freq: Once | INTRAMUSCULAR | Status: AC
  Administered 2015-08-14: 125 mg via INTRAVENOUS

## 2015-08-14 MED ORDER — METHYLPREDNISOLONE SODIUM SUCC 125 MG IJ SOLR
INTRAMUSCULAR | Status: AC
  Administered 2015-08-14: 125 mg via INTRAVENOUS
  Filled 2015-08-14: qty 2

## 2015-08-14 MED ORDER — FAMOTIDINE IN NACL 20-0.9 MG/50ML-% IV SOLN
INTRAVENOUS | Status: AC
  Administered 2015-08-14: 20 mg via INTRAVENOUS
  Filled 2015-08-14: qty 50

## 2015-08-14 MED ORDER — FAMOTIDINE 40 MG PO TABS: 40.0000 mg | ORAL_TABLET | Freq: Every evening | ORAL | Status: DC

## 2015-08-14 NOTE — ED Provider Notes (Signed)
[facility] Emergency Department Provider Note  ____________________________________________  Time seen: Approximately 5:25 AM  I have reviewed the triage vital signs and the nursing notes.   HISTORY  Chief Complaint Allergic Reaction    HPI Reginald Hernandez is a 49 y.o. male who comes in with hives today. The patient reports that he noticed the hives around 1030pm. The patient reports that he ate steak and was mowing the lawn prior to the hives showing up. The patient reports that he has had hives in the past but they were larger and accompanied by shortness of breath. The patient reports that this time the hives are much smaller than previous and he has had some dizziness. The patient denies any new soaps or detergents and reports that he is feeling improved currently.   Past Medical History  Diagnosis Date  . Renal disorder     kidney stones    There are no active problems to display for this patient.   History reviewed. No pertinent past surgical history.  Current Outpatient Rx  Name  Route  Sig  Dispense  Refill  . diphenhydrAMINE (BENADRYL) 25 MG tablet   Oral   Take 2 tablets (50 mg total) by mouth every 6 (six) hours. Take every 6 hours for the next 2 days, then every 6 hours PRN itching/hives after that   20 tablet   0   . EPINEPHrine 0.3 mg/0.3 mL IJ SOAJ injection   Intramuscular   Inject 0.3 mLs (0.3 mg total) into the muscle once. Inject into the muscle once for signs of an allergic reaction.   1 Device   2   . famotidine (PEPCID) 20 MG tablet   Oral   Take 1 tablet (20 mg total) by mouth 2 (two) times daily.   10 tablet   0   . famotidine (PEPCID) 40 MG tablet   Oral   Take 1 tablet (40 mg total) by mouth every evening.   7 tablet   0   . ibuprofen (ADVIL,MOTRIN) 600 MG tablet   Oral   Take 1 tablet (600 mg total) by mouth every 6 (six) hours as needed for pain.   30 tablet   0   . oxyCODONE-acetaminophen (PERCOCET)  5-325 MG per tablet   Oral   Take 1 tablet by mouth every 4 (four) hours as needed for pain.   20 tablet   0   . predniSONE (DELTASONE) 20 MG tablet   Oral   Take 3 tablets (60 mg total) by mouth daily.   15 tablet   0   . predniSONE (DELTASONE) 20 MG tablet   Oral   Take 3 tablets (60 mg total) by mouth daily.   12 tablet   0   . Tamsulosin HCl (FLOMAX) 0.4 MG CAPS   Oral   Take 1 capsule (0.4 mg total) by mouth daily. Patient not taking: Reported on 11/18/2014   10 capsule   0     Allergies Review of patient's allergies indicates no known allergies.  No family history on file.  Social History Social History  Substance Use Topics  . Smoking status: Current Every Day Smoker -- 0.50 packs/day  . Smokeless tobacco: None  . Alcohol Use: No    Review of Systems Constitutional: No fever/chills Eyes: No visual changes. ENT: No sore throat. Cardiovascular: Denies chest pain. Respiratory: Denies shortness of breath. Gastrointestinal: No abdominal pain.  No nausea, no vomiting.  No diarrhea.  No constipation.  Genitourinary: Negative for dysuria. Musculoskeletal: Negative for back pain. Skin: rash. Neurological: Dizziness.  10-point ROS otherwise negative.  ____________________________________________   PHYSICAL EXAM:  VITAL SIGNS: ED Triage Vitals  Enc Vitals Group     BP 08/14/15 0033 123/92 mmHg     Pulse Rate 08/14/15 0033 88     Resp 08/14/15 0033 18     Temp 08/14/15 0033 97.6 F (36.4 C)     Temp Source 08/14/15 0033 Oral     SpO2 08/14/15 0033 96 %     Weight 08/14/15 0033 168 lb (76.204 kg)     Height 08/14/15 0033 5\' 8"  (1.727 m)     Head Cir --      Peak Flow --      Pain Score 08/14/15 0047 2     Pain Loc --      Pain Edu? --      Excl. in GC? --     Constitutional: Alert and oriented. Well appearing and in no acute distress. Eyes: Conjunctivae are normal. PERRL. EOMI. Head: Atraumatic. Nose: No congestion/rhinnorhea. Mouth/Throat:  Mucous membranes are moist.  Oropharynx non-erythematous. Cardiovascular: Normal rate, regular rhythm. Grossly normal heart sounds.  Good peripheral circulation. Respiratory: Normal respiratory effort.  No retractions. Lungs CTAB. Gastrointestinal: Soft and nontender. No distention. Positive bowel sounds Musculoskeletal: No lower extremity tenderness nor edema.  No joint effusions. Neurologic:  Normal speech and language.  Skin:  Diffuse small light colored hives all over body Psychiatric: Mood and affect are normal.   ____________________________________________   LABS (all labs ordered are listed, but only abnormal results are displayed)  Labs Reviewed - No data to display ____________________________________________  EKG  None ____________________________________________  RADIOLOGY  None ____________________________________________   PROCEDURES  Procedure(s) performed: None  Critical Care performed: No  ____________________________________________   INITIAL IMPRESSION / ASSESSMENT AND PLAN / ED COURSE  Pertinent labs & imaging results that were available during my care of the patient were reviewed by me and considered in my medical decision making (see chart for details).  This is a 49 year old male who comes in today with hives all over his body. The patient was given some Benadryl, Solu-Medrol and Pepcid. In the hives had improved. I will discharge the patient to home with some medication and have him follow-up with an allergist for further evaluation. The patient has no concerns or complaints. Currently he is not itching not having any swelling as her shortness of breath. ____________________________________________   FINAL CLINICAL IMPRESSION(S) / ED DIAGNOSES  Final diagnoses:  Hives      Rebecka Apley, MD 08/14/15 [phone]

## 2015-08-14 NOTE — ED Notes (Signed)
Pt reports started about an hour ago with itching on his ankle and now has rash and hives over his entire body.

## 2015-08-14 NOTE — Discharge Instructions (Signed)
Allergies °Allergies may happen from anything your body is sensitive to. This may be food, medicines, pollens, chemicals, and nearly anything around you in everyday life that produces allergens. An allergen is anything that causes an allergy producing substance. Heredity is often a factor in causing these problems. This means you may have some of the same allergies as your parents. °Food allergies happen in all age groups. Food allergies are some of the most severe and life threatening. Some common food allergies are cow's milk, seafood, eggs, nuts, wheat, and soybeans. °SYMPTOMS  °· Swelling around the mouth. °· An itchy red rash or hives. °· Vomiting or diarrhea. °· Difficulty breathing. °SEVERE ALLERGIC REACTIONS ARE LIFE-THREATENING. °This reaction is called anaphylaxis. It can cause the mouth and throat to swell and cause difficulty with breathing and swallowing. In severe reactions only a trace amount of food (for example, peanut oil in a salad) may cause death within seconds. °Seasonal allergies occur in all age groups. These are seasonal because they usually occur during the same season every year. They may be a reaction to molds, grass pollens, or tree pollens. Other causes of problems are house dust mite allergens, pet dander, and mold spores. The symptoms often consist of nasal congestion, a runny itchy nose associated with sneezing, and tearing itchy eyes. There is often an associated itching of the mouth and ears. The problems happen when you come in contact with pollens and other allergens. Allergens are the particles in the air that the body reacts to with an allergic reaction. This causes you to release allergic antibodies. Through a chain of events, these eventually cause you to release histamine into the blood stream. Although it is meant to be protective to the body, it is this release that causes your discomfort. This is why you were given anti-histamines to feel better.  If you are unable to  pinpoint the offending allergen, it may be determined by skin or blood testing. Allergies cannot be cured but can be controlled with medicine. °Hay fever is a collection of all or some of the seasonal allergy problems. It may often be treated with simple over-the-counter medicine such as diphenhydramine. Take medicine as directed. Do not drink alcohol or drive while taking this medicine. Check with your caregiver or package insert for child dosages. °If these medicines are not effective, there are many new medicines your caregiver can prescribe. Stronger medicine such as nasal spray, eye drops, and corticosteroids may be used if the first things you try do not work well. Other treatments such as immunotherapy or desensitizing injections can be used if all else fails. Follow up with your caregiver if problems continue. These seasonal allergies are usually not life threatening. They are generally more of a nuisance that can often be handled using medicine. °HOME CARE INSTRUCTIONS  °· If unsure what causes a reaction, keep a diary of foods eaten and symptoms that follow. Avoid foods that cause reactions. °· If hives or rash are present: °· Take medicine as directed. °· You may use an over-the-counter antihistamine (diphenhydramine) for hives and itching as needed. °· Apply cold compresses (cloths) to the skin or take baths in cool water. Avoid hot baths or showers. Heat will make a rash and itching worse. °· If you are severely allergic: °· Following a treatment for a severe reaction, hospitalization is often required for closer follow-up. °· Wear a medic-alert bracelet or necklace stating the allergy. °· You and your family must learn how to give adrenaline or use   an anaphylaxis kit. °· If you have had a severe reaction, always carry your anaphylaxis kit or EpiPen® with you. Use this medicine as directed by your caregiver if a severe reaction is occurring. Failure to do so could have a fatal outcome. °SEEK MEDICAL  CARE IF: °· You suspect a food allergy. Symptoms generally happen within 30 minutes of eating a food. °· Your symptoms have not gone away within 2 days or are getting worse. °· You develop new symptoms. °· You want to retest yourself or your child with a food or drink you think causes an allergic reaction. Never do this if an anaphylactic reaction to that food or drink has happened before. Only do this under the care of a caregiver. °SEEK IMMEDIATE MEDICAL CARE IF:  °· You have difficulty breathing, are wheezing, or have a tight feeling in your chest or throat. °· You have a swollen mouth, or you have hives, swelling, or itching all over your body. °· You have had a severe reaction that has responded to your anaphylaxis kit or an EpiPen®. These reactions may return when the medicine has worn off. These reactions should be considered life threatening. °MAKE SURE YOU:  °· Understand these instructions. °· Will watch your condition. °· Will get help right away if you are not doing well or get worse. °Document Released: 03/04/2003 Document Revised: 04/05/2013 Document Reviewed: 08/08/2008 °[employer]® Patient Information ©2015 [employer]. This information is not intended to replace advice given to you by your health care provider. Make sure you discuss any questions you have with your health care provider. °Hives °Hives are itchy, red, swollen areas of the skin. They can vary in size and location on your body. Hives can come and go for hours or several days (acute hives) or for several weeks (chronic hives). Hives do not spread from person to person (noncontagious). They may get worse with scratching, exercise, and emotional stress. °CAUSES  °· Allergic reaction to food, additives, or drugs. °· Infections, including the common cold. °· Illness, such as vasculitis, lupus, or thyroid disease. °· Exposure to sunlight, heat, or cold. °· Exercise. °· Stress. °· Contact with chemicals. °SYMPTOMS  °· Red or white swollen  patches on the skin. The patches may change size, shape, and location quickly and repeatedly. °· Itching. °· Swelling of the hands, feet, and face. This may occur if hives develop deeper in the skin. °DIAGNOSIS  °Your caregiver can usually tell what is wrong by performing a physical exam. Skin or blood tests may also be done to determine the cause of your hives. In some cases, the cause cannot be determined. °TREATMENT  °Mild cases usually get better with medicines such as antihistamines. Severe cases may require an emergency epinephrine injection. If the cause of your hives is known, treatment includes avoiding that trigger.  °HOME CARE INSTRUCTIONS  °· Avoid causes that trigger your hives. °· Take antihistamines as directed by your caregiver to reduce the severity of your hives. Non-sedating or low-sedating antihistamines are usually recommended. Do not drive while taking an antihistamine. °· Take any other medicines prescribed for itching as directed by your caregiver. °· Wear loose-fitting clothing. °· Keep all follow-up appointments as directed by your caregiver. °SEEK MEDICAL CARE IF:  °· You have persistent or severe itching that is not relieved with medicine. °· You have painful or swollen joints. °SEEK IMMEDIATE MEDICAL CARE IF:  °· You have a fever. °· Your tongue or lips are swollen. °· You have trouble breathing   breathing or swallowing.  You feel tightness in the throat or chest.  You have abdominal pain. These problems may be the first sign of a life-threatening allergic reaction. Call your local emergency services (911 in [place] MAKE SURE YOU:   Understand these instructions.  Will watch your condition.  Will get help right away if you are not doing well or get worse. Document Released: 12/09/2005 Document Revised: 12/14/2013 Document Reviewed: 03/03/2012 [facility] Patient Information 2015 [place], [place]. This information is not intended to replace advice given to you by your health care provider.  Make sure you discuss any questions you have with your health care provider.

## 2015-08-14 NOTE — ED Notes (Signed)
Pt states yesterday around 2030 he began to have bilateral ankle itching while mowing grass. Pt states around 2200 he began to experience hives all over body. On arrival to ed pt with redness and hives noted to bilateral legs, arms, hands, chest. Pt with resolved hives to every part but arms, hives have lessened to arm, but are not completeley gone around elbows. Pt states 'i feel a lot better, i'm ready to get this iv out and go home." pt denies nausea, vomiting, diarrhea, difficulty breathing. Pt able to speak in full complete sentences without difficulty. Pt denies pain currently.

## 2016-04-06 ENCOUNTER — Emergency Department
Admission: EM | Admit: 2016-04-06 | Discharge: 2016-04-06 | Disposition: A | Discharge status: Home / Self Care | Payer: Self-pay | Attending: Emergency Medicine | Admitting: Emergency Medicine

## 2016-04-06 DIAGNOSIS — Z87442 Personal history of urinary calculi: Secondary | ICD-10-CM | POA: Insufficient documentation

## 2016-04-06 DIAGNOSIS — L500 Allergic urticaria: Secondary | ICD-10-CM | POA: Insufficient documentation

## 2016-04-06 DIAGNOSIS — T7840XA Allergy, unspecified, initial encounter: Secondary | ICD-10-CM

## 2016-04-06 DIAGNOSIS — L509 Urticaria, unspecified: Secondary | ICD-10-CM

## 2016-04-06 DIAGNOSIS — F17200 Nicotine dependence, unspecified, uncomplicated: Secondary | ICD-10-CM | POA: Insufficient documentation

## 2016-04-06 MED ORDER — FAMOTIDINE IN NACL 20-0.9 MG/50ML-% IV SOLN
20.0000 mg | Freq: Once | INTRAVENOUS | Status: AC
  Administered 2016-04-06: 20 mg via INTRAVENOUS
  Filled 2016-04-06: qty 50

## 2016-04-06 MED ORDER — METHYLPREDNISOLONE SODIUM SUCC 125 MG IJ SOLR
125.0000 mg | Freq: Once | INTRAMUSCULAR | Status: AC
  Administered 2016-04-06: 125 mg via INTRAVENOUS
  Filled 2016-04-06: qty 2

## 2016-04-06 MED ORDER — FAMOTIDINE 20 MG PO TABS: 20.0000 mg | ORAL_TABLET | Freq: Two times a day (BID) | ORAL | Status: DC

## 2016-04-06 MED ORDER — EPINEPHRINE 0.3 MG/0.3ML IJ SOAJ: 0.3000 mg | Freq: Once | INTRAMUSCULAR | Status: DC

## 2016-04-06 MED ORDER — PREDNISONE 20 MG PO TABS: ORAL_TABLET | ORAL | Status: DC

## 2016-04-06 NOTE — ED Notes (Signed)
Pt. States rash developed about 45 minutes ago.  Pt. States hx of same with unknown source.  Pt. Has redness and whelps on front and back of trunk.  Pt. Denies difficulty breathing.

## 2016-04-06 NOTE — Discharge Instructions (Signed)
1. Take the following medicines for the next 4 days: °Prednisone 60mg daily °Pepcid 20mg twice daily °2. Take Benadryl as needed for itching. °3. Use Epi-Pen in case of acute, life-threatening allergic reaction. °4. Return to the ER for worsening symptoms, persistent vomiting, difficulty breathing or other concerns. ° °Allergies °An allergy is an abnormal reaction to a substance by the body's defense system (immune system). Allergies can develop at any age. °WHAT CAUSES ALLERGIES? °An allergic reaction happens when the immune system mistakenly reacts to a normally harmless substance, called an allergen, as if it were harmful. The immune system releases antibodies to fight the substance. Antibodies eventually release a chemical called histamine into the bloodstream. The release of histamine is meant to protect the body from infection, but it also causes discomfort. °An allergic reaction can be triggered by: °· Eating an allergen. °· Inhaling an allergen. °· Touching an allergen. °WHAT TYPES OF ALLERGIES ARE THERE? °There are many types of allergies. Common types include: °· Seasonal allergies. People with this type of allergy are usually allergic to substances that are only present during certain seasons, such as molds and pollens. °· Food allergies. °· Drug allergies. °· Insect allergies. °· Animal dander allergies. °WHAT ARE SYMPTOMS OF ALLERGIES? °Possible allergy symptoms include: °· Swelling of the lips, face, tongue, mouth, or throat. °· Sneezing, coughing, or wheezing. °· Nasal congestion. °· Tingling in the mouth. °· Rash. °· Itching. °· Itchy, red, swollen areas of skin (hives). °· Watery eyes. °· Vomiting. °· Diarrhea. °· Dizziness. °· Lightheadedness. °· Fainting. °· Trouble breathing or swallowing. °· Chest tightness. °· Rapid heartbeat. °HOW ARE ALLERGIES DIAGNOSED? °Allergies are diagnosed with a medical and family history and one or more of the following: °· Skin tests. °· Blood tests. °· A food diary.  A food diary is a record of all the foods and drinks you have in a day and of all the symptoms you experience. °· The results of an elimination diet. An elimination diet involves eliminating foods from your diet and then adding them back in one by one to find out if a certain food causes an allergic reaction. °HOW ARE ALLERGIES TREATED? °There is no cure for allergies, but allergic reactions can be treated with medicine. Severe reactions usually need to be treated at a hospital. °HOW CAN REACTIONS BE PREVENTED? °The best way to prevent an allergic reaction is by avoiding the substance you are allergic to. Allergy shots and medicines can also help prevent reactions in some cases. People with severe allergic reactions may be able to prevent a life-threatening reaction called anaphylaxis with a medicine given right after exposure to the allergen. °  °This information is not intended to replace advice given to you by your health care provider. Make sure you discuss any questions you have with your health care provider. °  °Document Released: 03/04/2003 Document Revised: 12/30/2014 Document Reviewed: 09/20/2014 °Elsevier Interactive Patient Education ©2016 [employer] ° °Hives °Hives are itchy, red, swollen areas of the skin. They can vary in size and location on your body. Hives can come and go for hours or several days (acute hives) or for several weeks (chronic hives). Hives do not spread from person to person (noncontagious). They may get worse with scratching, exercise, and emotional stress. °CAUSES  °· Allergic reaction to food, additives, or drugs. °· Infections, including the common cold. °· Illness, such as vasculitis, lupus, or thyroid disease. °· Exposure to sunlight, heat, or cold. °· Exercise. °· Stress. °· Contact with   chemicals. °SYMPTOMS  °· Red or white swollen patches on the skin. The patches may change size, shape, and location quickly and repeatedly. °· Itching. °· Swelling of the hands, feet, and  face. This may occur if hives develop deeper in the skin. °DIAGNOSIS  °Your caregiver can usually tell what is wrong by performing a physical exam. Skin or blood tests may also be done to determine the cause of your hives. In some cases, the cause cannot be determined. °TREATMENT  °Mild cases usually get better with medicines such as antihistamines. Severe cases may require an emergency epinephrine injection. If the cause of your hives is known, treatment includes avoiding that trigger.  °HOME CARE INSTRUCTIONS  °· Avoid causes that trigger your hives. °· Take antihistamines as directed by your caregiver to reduce the severity of your hives. Non-sedating or low-sedating antihistamines are usually recommended. Do not drive while taking an antihistamine. °· Take any other medicines prescribed for itching as directed by your caregiver. °· Wear loose-fitting clothing. °· Keep all follow-up appointments as directed by your caregiver. °SEEK MEDICAL CARE IF:  °· You have persistent or severe itching that is not relieved with medicine. °· You have painful or swollen joints. °SEEK IMMEDIATE MEDICAL CARE IF:  °· You have a fever. °· Your tongue or lips are swollen. °· You have trouble breathing or swallowing. °· You feel tightness in the throat or chest. °· You have abdominal pain. °These problems may be the first sign of a life-threatening allergic reaction. Call your local emergency services (911 in [place] °MAKE SURE YOU:  °· Understand these instructions. °· Will watch your condition. °· Will get help right away if you are not doing well or get worse. °  °This information is not intended to replace advice given to you by your health care provider. Make sure you discuss any questions you have with your health care provider. °  °Document Released: 12/09/2005 Document Revised: 12/14/2013 Document Reviewed: 03/03/2012 °Elsevier Interactive Patient Education ©2016 [employer]. ° °

## 2016-04-06 NOTE — ED Notes (Signed)
Pt. Going home with family. 

## 2016-04-06 NOTE — ED Notes (Signed)
Patient reports allergic reaction for approximately 30 minutes.  Unknown allergan.  Patient with redness and whelps over trunk with itching.

## 2016-04-06 NOTE — ED Provider Notes (Signed)
[facility] Emergency Department Provider Note  ____________________________________________  Time seen: Approximately 3:43 AM  I have reviewed the triage vital signs and the nursing notes.   HISTORY  Chief Complaint Allergic Reaction    HPI Reginald Hernandez is a 50 y.o. male who presents to the ED from home with a chief complaint of allergic reaction. Patient reports onset of pruritis and hives which started in his palms and feet and spread to his extremities and trunk. Took 75 mg oral Benadryl prior to arrival. This is patient's third such reaction in 2 years; he has not followed up for allergy testing. Denies new medicines including over-the-counter and antibiotics, food, exposures. Denies recent fever, chills, chest pain, shortness of breath, abdominal pain, nausea, vomiting, diarrhea. Denies recent travel or trauma. Nothing makes his symptoms better or worse.   Past Medical History  Diagnosis Date  . Renal disorder     kidney stones    There are no active problems to display for this patient.   No past surgical history on file.  Current Outpatient Rx  Name  Route  Sig  Dispense  Refill  . diphenhydrAMINE (BENADRYL) 25 MG tablet   Oral   Take 2 tablets (50 mg total) by mouth every 6 (six) hours. Take every 6 hours for the next 2 days, then every 6 hours PRN itching/hives after that   20 tablet   0   . EPINEPHrine 0.3 mg/0.3 mL IJ SOAJ injection   Intramuscular   Inject 0.3 mLs (0.3 mg total) into the muscle once. Inject into the muscle once for signs of an allergic reaction.   1 Device   2   . famotidine (PEPCID) 20 MG tablet   Oral   Take 1 tablet (20 mg total) by mouth 2 (two) times daily.   10 tablet   0   . famotidine (PEPCID) 40 MG tablet   Oral   Take 1 tablet (40 mg total) by mouth every evening.   7 tablet   0   . ibuprofen (ADVIL,MOTRIN) 600 MG tablet   Oral   Take 1 tablet (600 mg total) by mouth every 6 (six) hours as  needed for pain.   30 tablet   0   . oxyCODONE-acetaminophen (PERCOCET) 5-325 MG per tablet   Oral   Take 1 tablet by mouth every 4 (four) hours as needed for pain.   20 tablet   0   . predniSONE (DELTASONE) 20 MG tablet   Oral   Take 3 tablets (60 mg total) by mouth daily.   15 tablet   0   . predniSONE (DELTASONE) 20 MG tablet   Oral   Take 3 tablets (60 mg total) by mouth daily.   12 tablet   0   . Tamsulosin HCl (FLOMAX) 0.4 MG CAPS   Oral   Take 1 capsule (0.4 mg total) by mouth daily. Patient not taking: Reported on 11/18/2014   10 capsule   0     Allergies Review of patient's allergies indicates no known allergies.  No family history on file.  Social History Social History  Substance Use Topics  . Smoking status: Current Every Day Smoker -- 0.50 packs/day  . Smokeless tobacco: Not on file  . Alcohol Use: No    Review of Systems  Constitutional: No fever/chills. Eyes: No visual changes. ENT: No sore throat. Cardiovascular: Denies chest pain. Respiratory: Denies shortness of breath. Gastrointestinal: No abdominal pain.  No nausea, no  vomiting.  No diarrhea.  No constipation. Genitourinary: Negative for dysuria. Musculoskeletal: Negative for back pain. Skin: Positive for rash. Neurological: Negative for headaches, focal weakness or numbness.  10-point ROS otherwise negative.  ____________________________________________   PHYSICAL EXAM:  VITAL SIGNS: ED Triage Vitals  Enc Vitals Group     BP 04/06/16 0159 148/95 mmHg     Pulse Rate 04/06/16 0159 87     Resp 04/06/16 0159 20     Temp 04/06/16 0159 97.5 F (36.4 C)     Temp Source 04/06/16 0159 Oral     SpO2 04/06/16 0159 95 %     Weight 04/06/16 0159 176 lb (79.833 kg)     Height 04/06/16 0159 5\' 8"  (1.727 m)     Head Cir --      Peak Flow --      Pain Score --      Pain Loc --      Pain Edu? --      Excl. in GC? --     Constitutional: Alert and oriented. Well appearing and in no  acute distress. Eyes: Conjunctivae are normal. PERRL. EOMI. Head: Atraumatic. Nose: No congestion/rhinnorhea. Mouth/Throat: Mucous membranes are moist.  Oropharynx non-erythematous.  There is no tongue swelling or angioedema. There is no hoarse, muffled voice or drooling. Neck: No stridor.   Cardiovascular: Normal rate, regular rhythm. Grossly normal heart sounds.  Good peripheral circulation. Respiratory: Normal respiratory effort.  No retractions. Lungs CTAB. No wheezing. Gastrointestinal: Soft and nontender. No distention. No abdominal bruits. No CVA tenderness. Musculoskeletal: No lower extremity tenderness nor edema.  No joint effusions. Neurologic:  Normal speech and language. No gross focal neurologic deficits are appreciated. No gait instability. Skin:  Skin is warm, dry and intact. No petechiae. Resolving hives to all extremities and trunk. Psychiatric: Mood and affect are normal. Speech and behavior are normal.  ____________________________________________   LABS (all labs ordered are listed, but only abnormal results are displayed)  Labs Reviewed - No data to display ____________________________________________  EKG  None ____________________________________________  RADIOLOGY  None ____________________________________________   PROCEDURES  Procedure(s) performed: None  Critical Care performed: No  ____________________________________________   INITIAL IMPRESSION / ASSESSMENT AND PLAN / ED COURSE  Pertinent labs & imaging results that were available during my care of the patient were reviewed by me and considered in my medical decision making (see chart for details).  50 year old male who presents with acute allergic reaction with urticaria; there is no evidence of angioedema. Patient received IV Solu-Medrol and IV Pepcid with good relief of symptoms. Will continue patient on 4 additional days of prednisone and Pepcid, as well as follow-up with ENT for  allergy testing. Strict return precautions given. Patient is best verbalize understanding and agree with plan of care. ____________________________________________   FINAL CLINICAL IMPRESSION(S) / ED DIAGNOSES  Final diagnoses:  Allergic reaction, initial encounter  Hives      Irean Hong, MD 04/06/16 [phone]

## 2019-11-29 ENCOUNTER — Emergency Department: Payer: Self-pay

## 2019-11-29 ENCOUNTER — Inpatient Hospital Stay
Admission: EM | Admit: 2019-11-29 | Discharge: 2019-12-01 | DRG: 292 | Disposition: A | Discharge status: Home / Self Care | Payer: Self-pay | Attending: Internal Medicine | Admitting: Internal Medicine

## 2019-11-29 ENCOUNTER — Encounter: Payer: Self-pay | Admitting: Emergency Medicine

## 2019-11-29 ENCOUNTER — Other Ambulatory Visit: Payer: Self-pay

## 2019-11-29 DIAGNOSIS — I429 Cardiomyopathy, unspecified: Secondary | ICD-10-CM

## 2019-11-29 DIAGNOSIS — Z87442 Personal history of urinary calculi: Secondary | ICD-10-CM

## 2019-11-29 DIAGNOSIS — Z8249 Family history of ischemic heart disease and other diseases of the circulatory system: Secondary | ICD-10-CM

## 2019-11-29 DIAGNOSIS — E119 Type 2 diabetes mellitus without complications: Secondary | ICD-10-CM | POA: Diagnosis present

## 2019-11-29 DIAGNOSIS — R1013 Epigastric pain: Secondary | ICD-10-CM

## 2019-11-29 DIAGNOSIS — I428 Other cardiomyopathies: Secondary | ICD-10-CM | POA: Diagnosis present

## 2019-11-29 DIAGNOSIS — F17210 Nicotine dependence, cigarettes, uncomplicated: Secondary | ICD-10-CM | POA: Diagnosis present

## 2019-11-29 DIAGNOSIS — R1011 Right upper quadrant pain: Secondary | ICD-10-CM

## 2019-11-29 DIAGNOSIS — Z720 Tobacco use: Secondary | ICD-10-CM

## 2019-11-29 DIAGNOSIS — Z23 Encounter for immunization: Secondary | ICD-10-CM

## 2019-11-29 DIAGNOSIS — I509 Heart failure, unspecified: Secondary | ICD-10-CM

## 2019-11-29 DIAGNOSIS — Z7151 Drug abuse counseling and surveillance of drug abuser: Secondary | ICD-10-CM

## 2019-11-29 DIAGNOSIS — F1910 Other psychoactive substance abuse, uncomplicated: Secondary | ICD-10-CM | POA: Diagnosis present

## 2019-11-29 DIAGNOSIS — I2510 Atherosclerotic heart disease of native coronary artery without angina pectoris: Secondary | ICD-10-CM | POA: Diagnosis present

## 2019-11-29 DIAGNOSIS — R778 Other specified abnormalities of plasma proteins: Secondary | ICD-10-CM

## 2019-11-29 DIAGNOSIS — Z716 Tobacco abuse counseling: Secondary | ICD-10-CM

## 2019-11-29 DIAGNOSIS — J90 Pleural effusion, not elsewhere classified: Secondary | ICD-10-CM

## 2019-11-29 DIAGNOSIS — Z20828 Contact with and (suspected) exposure to other viral communicable diseases: Secondary | ICD-10-CM | POA: Diagnosis present

## 2019-11-29 DIAGNOSIS — I5023 Acute on chronic systolic (congestive) heart failure: Principal | ICD-10-CM | POA: Diagnosis present

## 2019-11-29 LAB — CBC
HCT: 42.7 % (ref 39.0–52.0)
Hemoglobin: 14.1 g/dL (ref 13.0–17.0)
MCH: 29.5 pg (ref 26.0–34.0)
MCHC: 33 g/dL (ref 30.0–36.0)
MCV: 89.3 fL (ref 80.0–100.0)
Platelets: 240 10*3/uL (ref 150–400)
RBC: 4.78 MIL/uL (ref 4.22–5.81)
RDW: 14.9 % (ref 11.5–15.5)
WBC: 10 10*3/uL (ref 4.0–10.5)
nRBC: 0 % (ref 0.0–0.2)

## 2019-11-29 LAB — LIPASE, BLOOD: Lipase: 21 U/L (ref 11–51)

## 2019-11-29 LAB — TROPONIN I (HIGH SENSITIVITY)
Troponin I (High Sensitivity): 34 ng/L — ABNORMAL HIGH (ref <18)
Troponin I (High Sensitivity): 35 ng/L — ABNORMAL HIGH (ref <18)
Troponin I (High Sensitivity): 37 ng/L — ABNORMAL HIGH (ref <18)
Troponin I (High Sensitivity): 32 ng/L — ABNORMAL HIGH (ref <18)
Troponin I (High Sensitivity): 35 ng/L — ABNORMAL HIGH (ref <18)

## 2019-11-29 LAB — BASIC METABOLIC PANEL
Anion gap: 10 (ref 5–15)
BUN: 15 mg/dL (ref 6–20)
CO2: 24 mmol/L (ref 22–32)
Calcium: 8.8 mg/dL — ABNORMAL LOW (ref 8.9–10.3)
Chloride: 106 mmol/L (ref 98–111)
Creatinine, Ser: 1.15 mg/dL (ref 0.61–1.24)
GFR calc Af Amer: >60 mL/min (ref >60)
GFR calc non Af Amer: >60 mL/min (ref >60)
Glucose, Bld: 159 mg/dL — ABNORMAL HIGH (ref 70–99)
Potassium: 3.7 mmol/L (ref 3.5–5.1)
Sodium: 140 mmol/L (ref 135–145)

## 2019-11-29 LAB — HEPATIC FUNCTION PANEL
ALT: 19 U/L (ref 0–44)
AST: 23 U/L (ref 15–41)
Albumin: 3.7 g/dL (ref 3.5–5.0)
Alkaline Phosphatase: 78 U/L (ref 38–126)
Bilirubin, Direct: 0.1 mg/dL (ref 0.0–0.2)
Indirect Bilirubin: 0.6 mg/dL (ref 0.3–0.9)
Total Bilirubin: 0.7 mg/dL (ref 0.3–1.2)
Total Protein: 7.2 g/dL (ref 6.5–8.1)

## 2019-11-29 LAB — LACTIC ACID, PLASMA
Lactic Acid, Venous: 2 mmol/L (ref 0.5–1.9)
Lactic Acid, Venous: 1 mmol/L (ref 0.5–1.9)

## 2019-11-29 LAB — PROTIME-INR
INR: 1.2 (ref 0.8–1.2)
Prothrombin Time: 15.5 seconds — ABNORMAL HIGH (ref 11.4–15.2)

## 2019-11-29 LAB — BRAIN NATRIURETIC PEPTIDE: B Natriuretic Peptide: 1995 pg/mL — ABNORMAL HIGH (ref 0.0–100.0)

## 2019-11-29 LAB — APTT: aPTT: 31 seconds (ref 24–36)

## 2019-11-29 MED ORDER — PANTOPRAZOLE SODIUM 40 MG PO TBEC
40.0000 mg | DELAYED_RELEASE_TABLET | Freq: Every day | ORAL | Status: DC
  Administered 2019-11-29 – 2019-12-01 (×3): 40 mg via ORAL
  Filled 2019-11-29 (×3): qty 1

## 2019-11-29 MED ORDER — NICOTINE 21 MG/24HR TD PT24
21.0000 mg | MEDICATED_PATCH | Freq: Every day | TRANSDERMAL | Status: DC
  Filled 2019-11-29 (×2): qty 1

## 2019-11-29 MED ORDER — NITROGLYCERIN 0.4 MG SL SUBL: 0.4000 mg | SUBLINGUAL_TABLET | SUBLINGUAL | Status: DC | PRN

## 2019-11-29 MED ORDER — HEPARIN SODIUM (PORCINE) 5000 UNIT/ML IJ SOLN
5000.0000 [IU] | Freq: Three times a day (TID) | INTRAMUSCULAR | Status: DC
  Administered 2019-11-29 – 2019-12-01 (×5): 5000 [IU] via SUBCUTANEOUS
  Filled 2019-11-29 (×6): qty 1

## 2019-11-29 MED ORDER — SODIUM CHLORIDE 0.9 % IV SOLN: 250.0000 mL | INTRAVENOUS | Status: DC | PRN

## 2019-11-29 MED ORDER — FUROSEMIDE 10 MG/ML IJ SOLN
40.0000 mg | Freq: Once | INTRAMUSCULAR | Status: AC
  Administered 2019-11-29: 14:00:00 40 mg via INTRAVENOUS
  Filled 2019-11-29: qty 4

## 2019-11-29 MED ORDER — ACETAMINOPHEN 325 MG PO TABS: 650.0000 mg | ORAL_TABLET | ORAL | Status: DC | PRN

## 2019-11-29 MED ORDER — MORPHINE SULFATE (PF) 4 MG/ML IV SOLN
4.0000 mg | Freq: Once | INTRAVENOUS | Status: AC
  Administered 2019-11-29: 10:00:00 4 mg via INTRAVENOUS
  Filled 2019-11-29: qty 1

## 2019-11-29 MED ORDER — MORPHINE SULFATE (PF) 2 MG/ML IV SOLN
2.0000 mg | INTRAVENOUS | Status: DC | PRN
  Filled 2019-11-29: qty 1

## 2019-11-29 MED ORDER — LISINOPRIL 5 MG PO TABS
2.5000 mg | ORAL_TABLET | Freq: Every day | ORAL | Status: DC
  Administered 2019-11-29 – 2019-11-30 (×2): 2.5 mg via ORAL
  Filled 2019-11-29 (×2): qty 1

## 2019-11-29 MED ORDER — ASPIRIN 81 MG PO CHEW
324.0000 mg | CHEWABLE_TABLET | Freq: Once | ORAL | Status: AC
  Administered 2019-11-29: 10:00:00 324 mg via ORAL
  Filled 2019-11-29: qty 4

## 2019-11-29 MED ORDER — ALUM & MAG HYDROXIDE-SIMETH 200-200-20 MG/5ML PO SUSP
30.0000 mL | Freq: Four times a day (QID) | ORAL | Status: DC | PRN
  Administered 2019-11-29: 16:00:00 30 mL via ORAL
  Filled 2019-11-29: qty 30

## 2019-11-29 MED ORDER — ASPIRIN EC 81 MG PO TBEC
81.0000 mg | DELAYED_RELEASE_TABLET | Freq: Every day | ORAL | Status: DC
  Administered 2019-11-30 – 2019-12-01 (×2): 81 mg via ORAL
  Filled 2019-11-29 (×3): qty 1

## 2019-11-29 MED ORDER — INFLUENZA VAC SPLIT QUAD 0.5 ML IM SUSY: 0.5000 mL | PREFILLED_SYRINGE | INTRAMUSCULAR | Status: DC

## 2019-11-29 MED ORDER — SODIUM CHLORIDE 0.9% FLUSH: 3.0000 mL | INTRAVENOUS | Status: DC | PRN

## 2019-11-29 MED ORDER — PNEUMOCOCCAL VAC POLYVALENT 25 MCG/0.5ML IJ INJ: 0.5000 mL | INJECTION | INTRAMUSCULAR | Status: DC

## 2019-11-29 MED ORDER — FUROSEMIDE 10 MG/ML IJ SOLN
40.0000 mg | Freq: Every day | INTRAMUSCULAR | Status: DC
  Administered 2019-11-30: 09:00:00 40 mg via INTRAVENOUS
  Filled 2019-11-29: qty 4

## 2019-11-29 MED ORDER — SODIUM CHLORIDE 0.9% FLUSH
3.0000 mL | Freq: Two times a day (BID) | INTRAVENOUS | Status: DC
  Administered 2019-11-29 – 2019-12-01 (×4): 3 mL via INTRAVENOUS

## 2019-11-29 NOTE — Plan of Care (Signed)
  Problem: Education: Goal: Knowledge of General Education information will improve Description: Including pain rating scale, medication(s)/side effects and non-pharmacologic comfort measures Outcome: Progressing Note: Patient admitted at 2230. No complaints of pain. Patient profile completed. No respiratory distress noted. Patient is ready to go to sleep, will attempt education tomorrow.

## 2019-11-29 NOTE — ED Provider Notes (Signed)
[facility] Emergency Department Provider Note   ____________________________________________   First MD Initiated Contact with Patient 11/29/19 0913     (approximate)  I have reviewed the triage vital signs and the nursing notes.   HISTORY  Chief Complaint Abdominal Pain and Shortness of Breath    HPI Reginald Hernandez is a 53 y.o. male with possible history of kidney stones who presents to the ED complaining of abdominal pain and shortness of breath.  Patient reports that he initially started feeling short of breath about 1 week ago.  Difficulty breathing is primarily with exertion and patient states he can only walk from one side of the room to the other before having to stop and rest.  Symptoms have made it difficult for him to lie flat and he also reports swelling extending from his legs up into his abdomen.  He denies any history of similar symptoms and denies any cardiac history.  He does state he has been dealing with pain is in his epigastrium and right upper quadrant.  He states it is relatively constant but seems to be worse when he is eating.  He states that ever since Thanksgiving he has only been able to have a few bites before he feels nauseous and has to stop.  He denies ever being told he has gallstones.        Past Medical History:  Diagnosis Date  . Renal disorder    kidney stones    Patient Active Problem List   Diagnosis Date Noted  . Abdominal pain 11/29/2019  . Elevated troponin 11/29/2019  . Tobacco abuse 11/29/2019  . Acute CHF (congestive heart failure) (HCC) 11/29/2019  . CAD (coronary artery disease) 11/29/2019    History reviewed. No pertinent surgical history.  Prior to Admission medications   Not on File    Allergies Patient has no known allergies.  No family history on file.  Social History Social History   Tobacco Use  . Smoking status: Current Every Day Smoker    Packs/day: 0.50  Substance Use Topics   . Alcohol use: No  . Drug use: No    Review of Systems  Constitutional: No fever/chills Eyes: No visual changes. ENT: No sore throat. Cardiovascular: Denies chest pain. Respiratory: Positive for shortness of breath. Gastrointestinal: Positive for abdominal pain.  No nausea, no vomiting.  No diarrhea.  No constipation. Genitourinary: Negative for dysuria. Musculoskeletal: Negative for back pain. Skin: Negative for rash. Neurological: Negative for headaches, focal weakness or numbness.  ____________________________________________   PHYSICAL EXAM:  VITAL SIGNS: ED Triage Vitals  Enc Vitals Group     BP 11/29/19 0859 (!) 141/105     Pulse Rate 11/29/19 0859 (!) 109     Resp 11/29/19 0859 19     Temp --      Temp Source 11/29/19 0859 Oral     SpO2 11/29/19 0859 96 %     Weight 11/29/19 0856 205 lb (93 kg)     Height 11/29/19 0856 5\' 8"  (1.727 m)     Head Circumference --      Peak Flow --      Pain Score 11/29/19 0855 8     Pain Loc --      Pain Edu? --      Excl. in GC? --     Constitutional: Alert and oriented. Eyes: Conjunctivae are normal. Head: Atraumatic. Nose: No congestion/rhinnorhea. Mouth/Throat: Mucous membranes are moist. Neck: Normal ROM Cardiovascular: Tachycardic, regular rhythm.  Grossly normal heart sounds. Respiratory: Normal respiratory effort.  No retractions. Lungs CTAB. Gastrointestinal: Soft and tender to palpation in the epigastrium and right upper quadrant. No distention. Genitourinary: deferred Musculoskeletal: No lower extremity tenderness, 2+ pitting edema up to thighs bilaterally. Neurologic:  Normal speech and language. No gross focal neurologic deficits are appreciated. Skin:  Skin is warm, dry and intact. No rash noted. Psychiatric: Mood and affect are normal. Speech and behavior are normal.  ____________________________________________   LABS (all labs ordered are listed, but only abnormal results are displayed)  Labs Reviewed   BASIC METABOLIC PANEL - Abnormal; Notable for the following components:      Result Value   Glucose, Bld 159 (*)    Calcium 8.8 (*)    All other components within normal limits  LACTIC ACID, PLASMA - Abnormal; Notable for the following components:   Lactic Acid, Venous 2.0 (*)    All other components within normal limits  BRAIN NATRIURETIC PEPTIDE - Abnormal; Notable for the following components:   B Natriuretic Peptide 1,995.0 (*)    All other components within normal limits  TROPONIN I (HIGH SENSITIVITY) - Abnormal; Notable for the following components:   Troponin I (High Sensitivity) 34 (*)    All other components within normal limits  TROPONIN I (HIGH SENSITIVITY) - Abnormal; Notable for the following components:   Troponin I (High Sensitivity) 35 (*)    All other components within normal limits  TROPONIN I (HIGH SENSITIVITY) - Abnormal; Notable for the following components:   Troponin I (High Sensitivity) 37 (*)    All other components within normal limits  SARS CORONAVIRUS 2 (TAT 6-24 HRS)  CBC  LACTIC ACID, PLASMA  LIPASE, BLOOD  HEPATIC FUNCTION PANEL  TROPONIN I (HIGH SENSITIVITY)   ____________________________________________  EKG  ED ECG REPORT I, Chesley Noon, the attending physician, personally viewed and interpreted this ECG.   Date: 11/29/2019  EKG Time: 9:03  Rate: 108  Rhythm: sinus tachycardia  Axis: LAD  Intervals:none  ST&T Change: Q waves inferiorly with T wave inversions laterally    PROCEDURES  Procedure(s) performed (including Critical Care):  Procedures   ____________________________________________   INITIAL IMPRESSION / ASSESSMENT AND PLAN / ED COURSE       53 year old male presents to the ED with 1 week of worsening dyspnea on exertion as well as lower extremity swelling, denies any associated chest pain but has had epigastric pain and decreased appetite.  Initial EKG shows new Q waves inferiorly as well as T wave inversions  laterally, does not meet STEMI criteria but concerning for acute MI.  His pain does seem very atypical for ACS and is reproducible with palpation in the epigastrium and right upper quadrant.  Repeat EKG similar to one obtained at triage, no apparent dynamic changes.  I suspect he had a cardiac event at some point resulting in EKG findings and now resulting in new onset CHF.  Troponin is mildly elevated but stable on repeat, would hold off on heparin for now and initiate diuresis.  Work-up for his epigastric pain is unremarkable, right upper quadrant ultrasound negative for acute process.  His LFTs and lipase are within normal limits.  Pain may be a result of peptic ulcer disease versus gastritis given describes early satiety, but there is no evidence of bleeding.  We will admit for further work-up of new onset CHF, case discussed with hospitalist, who accepts patient for admission.      ____________________________________________   FINAL CLINICAL IMPRESSION(S) /  ED DIAGNOSES  Final diagnoses:  RUQ pain  Acute congestive heart failure, unspecified heart failure type ([place])  Pleural effusion     ED Discharge Orders    None       Note:  This document was prepared using Dragon voice recognition software and may include unintentional dictation errors.   Blake Divine, MD 11/29/19 [phone]

## 2019-11-29 NOTE — ED Notes (Signed)
Rainbow, gray on ICE was sent to lab.

## 2019-11-29 NOTE — ED Notes (Signed)
Pt from main ED for admit hold.  He is sleepy, but awakens easily.  Skin is very slightly diaphoretic.  His pulse ox goes from 90s down to 85, so I increased oxygen to 4 liters canula.  Second troponin drawn.

## 2019-11-29 NOTE — ED Notes (Signed)
Pulse ox is maintainging >95%.  He does continue to have apnea alarm on monitor occasionally.  He awakens easily.

## 2019-11-29 NOTE — Progress Notes (Signed)
@   2015 Noted that patient's blood pressure is elevated. Patient denies shortness of breath or chest pain at this time. Pt. Ordered lisinopril 2.5 po to be administered. See MAR. Notified receiving nurse Ria Comment for patient being transferred to room 251. Will continue to monitor.   Patient's blood pressure obtained after medication administered. See flow sheet. Notified receving nurse, Everett Graff,  of patient's status.

## 2019-11-29 NOTE — Progress Notes (Signed)
PT Cancellation Note  Patient Details Name: ANTAEUS KAREL MRN: [id number] DOB: 1966/03/17   Cancelled Treatment:    Reason Eval/Treat Not Completed: Patient not medically ready, Waiting for troponin values and cardio consult before evaluation.    Alanson Puls, [place] DPT 11/29/2019, 3:58 PM

## 2019-11-29 NOTE — ED Notes (Signed)
Could not get temp on pt. Reported to RN Opal Sidles all other vitals are in,bloodwork,and EKG has been done.

## 2019-11-29 NOTE — H&P (Signed)
History and Physical    TREGAN READ JKK:[id number] DOB: 1966-08-03 DOA: 11/29/2019  Referring MD/NP/PA:   PCP: Patient, No Pcp Per   Patient coming from:  The patient is coming from home.  At baseline, pt is independent for most of ADL.        Chief Complaint: SOB and epigastric abdominal pain  HPI: Reginald Hernandez is a 53 y.o. male with medical history significant of tobacco abuse, kidney stone, who presents with epigastric abdominal pain and shortness of breath.  Patient states that he has been having shortness breath for more than 1 week, which has been progressively worsening.  He also has bilateral lower leg edema.  Denies chest pain, fever or chills.  He has mild dry cough.  He also reports epigastric abdominal pain, no nausea, vomiting or diarrhea.  Denies symptoms of UTI or unilateral weakness.  ED Course: pt was found to have BNP 1995, troponin 34, 35, 37, lactic acid 2.0 -->1.0, lipase 21, pending COVID-19 test, electrolytes renal function okay, temperature normal, blood pressure 141/105, heart rate 109, oxygen saturation 96% on room air.  Patient is admitted to telemetry bed as inpatient.  Cardiology, Dr. Humphrey Rolls was consulted.  # CXR showed: Moderate size right pleural effusion likely with associated basilar atelectasis. Mild cardiomegaly. # US-RUQ: Minimal ascites. No other significant abnormality seen in the right upper quadrant of the abdomen.   Review of Systems:   General: no fevers, chills, has fatigue HEENT: no blurry vision, hearing changes or sore throat Respiratory: has dyspnea, coughing, no wheezing CV: no chest pain, no palpitations GI: no nausea, vomiting, has epigastric abdominal pain, no diarrhea, constipation GU: no dysuria, burning on urination, increased urinary frequency, hematuria  Ext: has leg edema Neuro: no unilateral weakness, numbness, or tingling, no vision change or hearing loss Skin: no rash, no skin tear. MSK: No muscle spasm, no deformity,  no limitation of range of movement in spin Heme: No easy bruising.  Travel history: No recent long distant travel.  Allergy: No Known Allergies  Past Medical History:  Diagnosis Date  . Renal disorder    kidney stones    Past Surgical History:  Procedure Laterality Date  . TONSILLECTOMY      Social History:  reports that he has been smoking. He has been smoking about 0.50 packs per day. He does not have any smokeless tobacco history on file. He reports that he does not drink alcohol or use drugs.  Family History:  Family History  Problem Relation Age of Onset  . Diabetes Mellitus II Mother   . Heart disease Father      Prior to Admission medications   Not on File    Physical Exam: Vitals:   11/29/19 1514 11/29/19 1600 11/29/19 1930 11/29/19 2104  BP: (!) 133/117 (!) 137/105 (!) 130/102 (!) 131/102  Pulse: (!) 102 94 (!) 104 (!) 103  Resp: (!) 30 12 16    Temp:   97.9 F (36.6 C)   TempSrc:   Oral   SpO2: 97% 97% 100%   Weight:      Height:       General: Not in acute distress HEENT:       Eyes: PERRL, EOMI, no scleral icterus.       ENT: No discharge from the ears and nose, no pharynx injection, no tonsillar enlargement.        Neck: No JVD, no bruit, no mass felt. Heme: No neck lymph node enlargement. Cardiac: S1/S2, RRR,  No murmurs, No gallops or rubs. Respiratory: has fine crackles at the base. GI: Soft, nondistended, nontender, no rebound pain, no organomegaly, BS present. GU: No hematuria Ext: 3+ pitting leg edema bilaterally. 2+DP/PT pulse bilaterally. Musculoskeletal: No joint deformities, No joint redness or warmth, no limitation of ROM in spin. Skin: No rashes.  Neuro: Alert, oriented X3, cranial nerves II-XII grossly intact, moves all extremities normally.  Psych: Patient is not psychotic, no suicidal or hemocidal ideation.  Labs on Admission: I have personally reviewed following labs and imaging studies  CBC: Recent Labs  Lab 11/29/19 0900   WBC 10.0  HGB 14.1  HCT 42.7  MCV 89.3  PLT 240   Basic Metabolic Panel: Recent Labs  Lab 11/29/19 0900  NA 140  K 3.7  CL 106  CO2 24  GLUCOSE 159*  BUN 15  CREATININE 1.15  CALCIUM 8.8*   GFR: Estimated Creatinine Clearance: 80.3 mL/min (by C-G formula based on SCr of 1.15 mg/dL). Liver Function Tests: Recent Labs  Lab 11/29/19 0900  AST 23  ALT 19  ALKPHOS 78  BILITOT 0.7  PROT 7.2  ALBUMIN 3.7   Recent Labs  Lab 11/29/19 0900  LIPASE 21   No results for input(s): AMMONIA in the last 168 hours. Coagulation Profile: No results for input(s): INR, PROTIME in the last 168 hours. Cardiac Enzymes: No results for input(s): CKTOTAL, CKMB, CKMBINDEX, TROPONINI in the last 168 hours. BNP (last 3 results) No results for input(s): PROBNP in the last 8760 hours. HbA1C: No results for input(s): HGBA1C in the last 72 hours. CBG: No results for input(s): GLUCAP in the last 168 hours. Lipid Profile: No results for input(s): CHOL, HDL, LDLCALC, TRIG, CHOLHDL, LDLDIRECT in the last 72 hours. Thyroid Function Tests: No results for input(s): TSH, T4TOTAL, FREET4, T3FREE, THYROIDAB in the last 72 hours. Anemia Panel: No results for input(s): VITAMINB12, FOLATE, FERRITIN, TIBC, IRON, RETICCTPCT in the last 72 hours. Urine analysis:    Component Value Date/Time   COLORURINE YELLOW 02/07/2013 0521   APPEARANCEUR CLEAR 02/07/2013 0521   LABSPEC 1.007 02/07/2013 0521   PHURINE 7.0 02/07/2013 0521   GLUCOSEU NEGATIVE 02/07/2013 0521   HGBUR LARGE (A) 02/07/2013 0521   BILIRUBINUR NEGATIVE 02/07/2013 0521   KETONESUR NEGATIVE 02/07/2013 0521   PROTEINUR NEGATIVE 02/07/2013 0521   UROBILINOGEN 0.2 02/07/2013 0521   NITRITE NEGATIVE 02/07/2013 0521   LEUKOCYTESUR NEGATIVE 02/07/2013 0521   Sepsis Labs: @LABRCNTIP (procalcitonin:4,lacticidven:4) )No results found for this or any previous visit (from the past 240 hour(s)).   Radiological Exams on Admission: Dg Chest 2  View  Result Date: 11/29/2019 CLINICAL DATA:  Mild epigastric pain and shortness of breath 1 week. EXAM: CHEST - 2 VIEW COMPARISON:  None. FINDINGS: Lungs are adequately inflated demonstrate a small to moderate size right pleural effusion likely with associated basilar atelectasis. Left lung is clear. Mild cardiomegaly. Mild degenerate change of the spine. IMPRESSION: Moderate size right pleural effusion likely with associated basilar atelectasis. Mild cardiomegaly. Electronically Signed   By: 14/06/2019 M.D.   On: 11/29/2019 09:36   14/06/2019 Abdomen Limited Ruq  Result Date: 11/29/2019 CLINICAL DATA:  Right upper quadrant abdominal pain. EXAM: ULTRASOUND ABDOMEN LIMITED RIGHT UPPER QUADRANT COMPARISON:  February 07, 2013. FINDINGS: Gallbladder: No gallstones or wall thickening visualized. No sonographic Murphy sign noted by sonographer. Common bile duct: Diameter: 2 mm which is within normal limits. Liver: No focal lesion identified. Within normal limits in parenchymal echogenicity. Portal vein is patent on color Doppler  imaging with normal direction of blood flow towards the liver. Other: Minimal ascites is noted. IMPRESSION: Minimal ascites. No other significant abnormality seen in the right upper quadrant of the abdomen. Electronically Signed   By: Lupita Raider M.D.   On: 11/29/2019 11:53     EKG: Independently reviewed.  Sinus rhythm, QTC 503, left axis deviation, left atrial enlargement, Q-wave in the inferior leads, T wave inversion in lateral leads and V4-V6.   Assessment/Plan Principal Problem:   Acute CHF (congestive heart failure) (HCC) Active Problems:   Epigastric abdominal pain   Elevated troponin   Tobacco abuse   Acute CHF (congestive heart failure) and elevated trop:  BNP 1995, 3+ leg edema and cardiomegaly on CXR, consistent with acute CHF. No 2d echo on record, not sure which type of CHF, pending 2d echo. Trop minimally elevated 34 -->35, possible demand ischemia. Card, Dr.  Welton Flakes was consulted.  -will admit to tele bed as inpt. -Lasix 40 mg daily by IV - add low dose lisinopril 2.5 mg daily -trop x 3 -2d echo -Daily weights -strict I/O's -Low salt diet -Fluid restriction  Epigastric abdominal pain: possible gastritis vs. PUD as DD. -Protonix -prn Mylanta  Tobacco abuse: -Did counseling about importance of quitting smoking -Nicotine patch    Inpatient status:  # Patient requires inpatient status due to high intensity of service, high risk for further deterioration and high frequency of surveillance required.  I certify that at the point of admission it is my clinical judgment that the patient will require inpatient hospital care spanning beyond 2 midnights from the point of admission.  . This patient has multiple chronic comorbidities including tobacco abuse, kidney stone . Now patient has presenting with acute CHF (congestive heart failure), epigastric abdominal pain and elevated troponin . The worrisome physical exam findings include 3+ bilateral lower leg edema, epigastric abdominal tenderness, fine crackles on auscultation . The initial radiographic and laboratory data are worrisome because of elevated BNP 1995, elevated troponin, elevated lactic acid 2.0 . Current medical needs: please see my assessment and plan . Predictability of an adverse outcome (risk): Patient presents with acute CHF (congestive heart failure), epigastric abdominal pain and elevated troponin. No hx of CHF.  His presentation is highly complicated, at high risk of deteriorating.  Need to be treated in hospital for at least 2 days            DVT ppx: SQ Heparin    Code Status: Full code Family Communication: None at bed side.   Disposition Plan:  Anticipate discharge back to previous home environment Consults called:  Card. Dr. Welton Flakes Admission status: Inpatient/tele      Date of Service 11/29/2019    Lorretta Harp [facility]   If 7PM-7AM, please contact  night-coverage [email or web address] Password [facility] 11/29/2019, 9:47 PM

## 2019-11-29 NOTE — ED Triage Notes (Signed)
Pt reports abd pain to mid epigastric area and SOB for a week.

## 2019-11-30 ENCOUNTER — Other Ambulatory Visit: Payer: Self-pay

## 2019-11-30 ENCOUNTER — Inpatient Hospital Stay
Admit: 2019-11-30 | Discharge: 2019-11-30 | Disposition: A | Discharge status: Home / Self Care | Payer: Self-pay | Attending: Cardiovascular Disease | Admitting: Cardiovascular Disease

## 2019-11-30 DIAGNOSIS — I5023 Acute on chronic systolic (congestive) heart failure: Secondary | ICD-10-CM

## 2019-11-30 DIAGNOSIS — I5021 Acute systolic (congestive) heart failure: Secondary | ICD-10-CM

## 2019-11-30 DIAGNOSIS — F1910 Other psychoactive substance abuse, uncomplicated: Secondary | ICD-10-CM

## 2019-11-30 DIAGNOSIS — I429 Cardiomyopathy, unspecified: Secondary | ICD-10-CM

## 2019-11-30 LAB — BASIC METABOLIC PANEL
Anion gap: 10 (ref 5–15)
BUN: 17 mg/dL (ref 6–20)
CO2: 24 mmol/L (ref 22–32)
Calcium: 8.5 mg/dL — ABNORMAL LOW (ref 8.9–10.3)
Chloride: 105 mmol/L (ref 98–111)
Creatinine, Ser: 1.06 mg/dL (ref 0.61–1.24)
GFR calc Af Amer: >60 mL/min (ref >60)
GFR calc non Af Amer: >60 mL/min (ref >60)
Glucose, Bld: 142 mg/dL — ABNORMAL HIGH (ref 70–99)
Potassium: 4 mmol/L (ref 3.5–5.1)
Sodium: 139 mmol/L (ref 135–145)

## 2019-11-30 LAB — HIV ANTIBODY (ROUTINE TESTING W REFLEX): HIV Screen 4th Generation wRfx: NONREACTIVE

## 2019-11-30 LAB — URINE DRUG SCREEN, QUALITATIVE (ARMC ONLY)
Amphetamines, Ur Screen: POSITIVE — AB
Barbiturates, Ur Screen: NOT DETECTED
Benzodiazepine, Ur Scrn: NOT DETECTED
Cannabinoid 50 Ng, Ur ~~LOC~~: POSITIVE — AB
Cocaine Metabolite,Ur ~~LOC~~: NOT DETECTED
MDMA (Ecstasy)Ur Screen: NOT DETECTED
Methadone Scn, Ur: NOT DETECTED
Opiate, Ur Screen: POSITIVE — AB
Phencyclidine (PCP) Ur S: NOT DETECTED
Tricyclic, Ur Screen: NOT DETECTED

## 2019-11-30 LAB — LIPID PANEL
Cholesterol: 134 mg/dL (ref 0–200)
HDL: 23 mg/dL — ABNORMAL LOW (ref >40)
LDL Cholesterol: 92 mg/dL (ref 0–99)
Total CHOL/HDL Ratio: 5.8 RATIO
Triglycerides: 93 mg/dL (ref <150)
VLDL: 19 mg/dL (ref 0–40)

## 2019-11-30 LAB — SARS CORONAVIRUS 2 (TAT 6-24 HRS): SARS Coronavirus 2: NEGATIVE

## 2019-11-30 LAB — ECHOCARDIOGRAM COMPLETE
Height: 68 in
Weight: 3322.77 oz

## 2019-11-30 MED ORDER — CARVEDILOL 6.25 MG PO TABS
6.2500 mg | ORAL_TABLET | Freq: Two times a day (BID) | ORAL | Status: DC
  Administered 2019-11-30 – 2019-12-01 (×2): 6.25 mg via ORAL
  Filled 2019-11-30 (×2): qty 1

## 2019-11-30 MED ORDER — FUROSEMIDE 10 MG/ML IJ SOLN
40.0000 mg | Freq: Two times a day (BID) | INTRAMUSCULAR | Status: DC
  Administered 2019-11-30 – 2019-12-01 (×2): 40 mg via INTRAVENOUS
  Filled 2019-11-30 (×2): qty 4

## 2019-11-30 MED ORDER — SPIRONOLACTONE 25 MG PO TABS
25.0000 mg | ORAL_TABLET | Freq: Every day | ORAL | Status: DC
  Administered 2019-11-30 – 2019-12-01 (×2): 25 mg via ORAL
  Filled 2019-11-30 (×2): qty 1

## 2019-11-30 MED ORDER — SACUBITRIL-VALSARTAN 24-26 MG PO TABS: 1.0000 | ORAL_TABLET | Freq: Two times a day (BID) | ORAL | Status: DC

## 2019-11-30 NOTE — Progress Notes (Addendum)
[facility]  - [place] [facility]   PATIENT NAME: Reginald Hernandez    MR#:  [id number]  DATE OF BIRTH:  01-Aug-1966  SUBJECTIVE:  patient came in with increasing shortness of breath and leg swelling for last few days. He is a chronic smoker. Denies any cardiac history. Was found to be in congestive heart failure.  Receiving IV Lasix and feels better than yesterday.  REVIEW OF SYSTEMS:   Review of Systems  Constitutional: Negative for chills, fever and weight loss.  HENT: Negative for ear discharge, ear pain and nosebleeds.   Eyes: Negative for blurred vision, pain and discharge.  Respiratory: Positive for shortness of breath. Negative for sputum production, wheezing and stridor.   Cardiovascular: Positive for orthopnea and leg swelling. Negative for chest pain, palpitations and PND.  Gastrointestinal: Negative for abdominal pain, diarrhea, nausea and vomiting.  Genitourinary: Negative for frequency and urgency.  Musculoskeletal: Negative for back pain and joint pain.  Neurological: Positive for weakness. Negative for sensory change, speech change and focal weakness.  Psychiatric/Behavioral: Negative for depression and hallucinations. The patient is not nervous/anxious.    Tolerating Diet: yes Tolerating PT: outpatient PT  DRUG ALLERGIES:  No Known Allergies  VITALS:  Blood pressure (!) 136/93, pulse 100, temperature (!) 97.5 F (36.4 C), temperature source Oral, resp. rate 18, height 5\' 8"  (1.727 m), weight 94.2 kg, SpO2 98 %.  PHYSICAL EXAMINATION:   Physical Exam  GENERAL:  53 y.o.-year-old patient lying in the bed with no acute distress.  EYES: Pupils equal, round, reactive to light and accommodation. No scleral icterus. Extraocular muscles intact.  HEENT: Head atraumatic, normocephalic. Oropharynx and nasopharynx clear.  NECK:  Supple, no jugular venous distention. No thyroid enlargement, no tenderness.  LUNGS: decreased breath sounds bilaterally, no  wheezing, few rales, no rhonchi. No use of accessory muscles of respiration.  CARDIOVASCULAR: S1, S2 normal. No murmurs, rubs, or gallops.  ABDOMEN: Soft, nontender, nondistended. Bowel sounds present. No organomegaly or mass.  EXTREMITIES: No cyanosis, clubbing  +++ edema b/l.    NEUROLOGIC: Cranial nerves II through XII are intact. No focal Motor or sensory deficits b/l.   PSYCHIATRIC:  patient is alert and oriented x 3.  SKIN: No obvious rash, lesion, or ulcer.   LABORATORY PANEL:  CBC Recent Labs  Lab 11/29/19 0900  WBC 10.0  HGB 14.1  HCT 42.7  PLT 240    Chemistries  Recent Labs  Lab 11/29/19 0900 11/30/19 0557  NA 140 139  K 3.7 4.0  CL 106 105  CO2 24 24  GLUCOSE 159* 142*  BUN 15 17  CREATININE 1.15 1.06  CALCIUM 8.8* 8.5*  AST 23  --   ALT 19  --   ALKPHOS 78  --   BILITOT 0.7  --    Cardiac Enzymes No results for input(s): TROPONINI in the last 168 hours. RADIOLOGY:  Dg Chest 2 View  Result Date: 11/29/2019 CLINICAL DATA:  Mild epigastric pain and shortness of breath 1 week. EXAM: CHEST - 2 VIEW COMPARISON:  None. FINDINGS: Lungs are adequately inflated demonstrate a small to moderate size right pleural effusion likely with associated basilar atelectasis. Left lung is clear. Mild cardiomegaly. Mild degenerate change of the spine. IMPRESSION: Moderate size right pleural effusion likely with associated basilar atelectasis. Mild cardiomegaly. Electronically Signed   By: 14/06/2019 M.D.   On: 11/29/2019 09:36   14/06/2019 Abdomen Limited Ruq  Result Date: 11/29/2019 CLINICAL DATA:  Right upper quadrant abdominal pain.  EXAM: ULTRASOUND ABDOMEN LIMITED RIGHT UPPER QUADRANT COMPARISON:  February 07, 2013. FINDINGS: Gallbladder: No gallstones or wall thickening visualized. No sonographic Murphy sign noted by sonographer. Common bile duct: Diameter: 2 mm which is within normal limits. Liver: No focal lesion identified. Within normal limits in parenchymal echogenicity.  Portal vein is patent on color Doppler imaging with normal direction of blood flow towards the liver. Other: Minimal ascites is noted. IMPRESSION: Minimal ascites. No other significant abnormality seen in the right upper quadrant of the abdomen. Electronically Signed   By: Marijo Conception M.D.   On: 11/29/2019 11:53   ASSESSMENT AND PLAN:  Reginald Hernandez is a 53 y.o. male with medical history significant of tobacco abuse, kidney stone, who presents with epigastric abdominal pain and shortness of breath. Patient states that he has been having shortness breath for more than 1 week, which has been progressively worsening.  *Acute systolic CHF (congestive heart failure)  severe cardiomyopathy EF of 20% by echo-- new onset -patient presented with increasing shortness of breath with bilateral lower extremity edema for last few days. - BNP 1995, 3+ leg edema and cardiomegaly on CXR, consistent with acute CHF.  - Trop minimally elevated 34 -->35, possible demand ischemia. - Cardiology, Dr. Humphrey Rolls consult appreciated -IV Lasix 40 mg bid  -enteresto. spironolactone and coreg per dr Humphrey Rolls -Daily weights -strict I/O's -Low salt diet with Fluid restriction -REDS reading prior to discharge to ensure Adequate diuresis.  *Epigastric abdominal pain: possible gastritis vs. PUD as DD. -Protonix daily -prn Mylanta  *Tobacco abuse: -Did counseling about importance of quitting smoking -Nicotine patch  *polysubstance drug abuse -advised on abstaining from marijuana and amphetamines  Procedures: none Family communication :. Patient admits Consults :Cadiology Dr Humphrey Rolls Discharge Disposition :home CODE STATUS: full DVT Prophylaxis :heparin  TOTAL TIME TAKING CARE OF THIS PATIENT: *35 minutes.  >50% time spent on counselling and coordination of care  POSSIBLE D/C IN *2-3* DAYS, DEPENDING ON CLINICAL CONDITION.  Note: This dictation was prepared with Dragon dictation along with smaller phrase  technology. Any transcriptional errors that result from this process are unintentional.  Fritzi Mandes M.D on 11/30/2019 at 3:40 PM  Between 7am to 6pm - Pager - [phone]  After 6pm go to [email or web address]  [facility]   CC: Primary care physician; Patient, No Pcp PerPatient ID: Reginald Hernandez, male   DOB: 1966/07/11, 53 y.o.   MRN: [id number]

## 2019-11-30 NOTE — Progress Notes (Signed)
*  PRELIMINARY RESULTS* Echocardiogram 2D Echocardiogram has been performed.  Reginald Hernandez 11/30/2019, 11:55 AM

## 2019-11-30 NOTE — Consult Note (Signed)
Reginald Hernandez is a 53 y.o. male  [id number]  Primary Cardiologist: Adrian Blackwater Reason for Consultation: CHF  HPI: This is a 53 year old white male with a past medical history of smoking presented to the hospital with few days onset of shortness of breath orthopnea PND and leg swelling.  Patient has chest pain with deep inspiration which appears to be pleuritic type.   Review of Systems: No syncope   Past Medical History:  Diagnosis Date  . Renal disorder    kidney stones    No medications prior to admission.     Marland Kitchen aspirin EC  81 mg Oral Daily  . furosemide  40 mg Intravenous Daily  . heparin  5,000 Units Subcutaneous Q8H  . influenza vac split quadrivalent PF  0.5 mL Intramuscular Tomorrow-1000  . lisinopril  2.5 mg Oral Daily  . nicotine  21 mg Transdermal Daily  . pantoprazole  40 mg Oral Daily  . pneumococcal 23 valent vaccine  0.5 mL Intramuscular Tomorrow-1000  . sodium chloride flush  3 mL Intravenous Q12H  . spironolactone  25 mg Oral Daily    Infusions: . sodium chloride      No Known Allergies  Social History   Socioeconomic History  . Marital status: Married    Spouse name: Not on file  . Number of children: Not on file  . Years of education: Not on file  . Highest education level: Not on file  Occupational History  . Not on file  Social Needs  . Financial resource strain: Not on file  . Food insecurity    Worry: Not on file    Inability: Not on file  . Transportation needs    Medical: Not on file    Non-medical: Not on file  Tobacco Use  . Smoking status: Current Every Day Smoker    Packs/day: 0.50  . Smokeless tobacco: Never Used  Substance and Sexual Activity  . Alcohol use: No  . Drug use: No  . Sexual activity: Not on file  Lifestyle  . Physical activity    Days per week: Not on file    Minutes per session: Not on file  . Stress: Not on file  Relationships  . Social Musician on phone: Not on file    Gets  together: Not on file    Attends religious service: Not on file    Active member of club or organization: Not on file    Attends meetings of clubs or organizations: Not on file    Relationship status: Not on file  . Intimate partner violence    Fear of current or ex partner: Not on file    Emotionally abused: Not on file    Physically abused: Not on file    Forced sexual activity: Not on file  Other Topics Concern  . Not on file  Social History Narrative  . Not on file    Family History  Problem Relation Age of Onset  . Diabetes Mellitus II Mother   . Heart disease Father     PHYSICAL EXAM: Vitals:   11/30/19 0352 11/30/19 0857  BP: (!) 131/109 (!) 123/98  Pulse: 98 99  Resp:  14  Temp: (!) 97.5 F (36.4 C) 97.9 F (36.6 C)  SpO2: 95% 99%     Intake/Output Summary (Last 24 hours) at 11/30/2019 0924 Last data filed at 11/29/2019 2048 Gross per 24 hour  Intake 246 ml  Output 1900 ml  Net -1654 ml    General:  Well appearing. No respiratory difficulty HEENT: normal Neck: supple. no JVD. Carotids 2+ bilat; no bruits. No lymphadenopathy or thryomegaly appreciated. Cor: PMI nondisplaced. Regular rate & rhythm. No rubs, gallops or murmurs. Lungs: clear Abdomen: soft, nontender, nondistended. No hepatosplenomegaly. No bruits or masses. Good bowel sounds. Extremities: no cyanosis, clubbing, rash, edema Neuro: alert & oriented x 3, cranial nerves grossly intact. moves all 4 extremities w/o difficulty. Affect pleasant.  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 with nonspecific ST-T changes  Results for orders placed or performed during the hospital encounter of 11/29/19 (from the past 24 hour(s))  Troponin I (High Sensitivity)     Status: Abnormal   Collection Time: 11/29/19 10:58 AM  Result Value Ref Range   Troponin I (High Sensitivity) 35 (H) <18 ng/L  Lactic acid, plasma     Status: None   Collection Time: 11/29/19 11:13 AM  Result Value Ref Range   Lactic  Acid, Venous 1.0 0.5 - 1.9 mmol/L  SARS CORONAVIRUS 2 (TAT 6-24 HRS) Nasopharyngeal Nasopharyngeal Swab     Status: None   Collection Time: 11/29/19  1:21 PM   Specimen: Nasopharyngeal Swab  Result Value Ref Range   SARS Coronavirus 2 NEGATIVE NEGATIVE  Troponin I (High Sensitivity)     Status: Abnormal   Collection Time: 11/29/19  2:15 PM  Result Value Ref Range   Troponin I (High Sensitivity) 37 (H) <18 ng/L  Troponin I (High Sensitivity)     Status: Abnormal   Collection Time: 11/29/19  4:11 PM  Result Value Ref Range   Troponin I (High Sensitivity) 32 (H) <18 ng/L  Protime-INR     Status: Abnormal   Collection Time: 11/29/19  8:44 PM  Result Value Ref Range   Prothrombin Time 15.5 (H) 11.4 - 15.2 seconds   INR 1.2 0.8 - 1.2  APTT     Status: None   Collection Time: 11/29/19  8:44 PM  Result Value Ref Range   aPTT 31 24 - 36 seconds  Troponin I (High Sensitivity)     Status: Abnormal   Collection Time: 11/29/19 10:41 PM  Result Value Ref Range   Troponin I (High Sensitivity) 35 (H) <18 ng/L  Basic metabolic panel     Status: Abnormal   Collection Time: 11/30/19  5:57 AM  Result Value Ref Range   Sodium 139 135 - 145 mmol/L   Potassium 4.0 3.5 - 5.1 mmol/L   Chloride 105 98 - 111 mmol/L   CO2 24 22 - 32 mmol/L   Glucose, Bld 142 (H) 70 - 99 mg/dL   BUN 17 6 - 20 mg/dL   Creatinine, Ser 1.06 0.61 - 1.24 mg/dL   Calcium 8.5 (L) 8.9 - 10.3 mg/dL   GFR calc non Af Amer >60 >60 mL/min   GFR calc Af Amer >60 >60 mL/min   Anion gap 10 5 - 15  AM - FLP     Status: Abnormal   Collection Time: 11/30/19  5:57 AM  Result Value Ref Range   Cholesterol 134 0 - 200 mg/dL   Triglycerides 93 <150 mg/dL   HDL 23 (L) >40 mg/dL   Total CHOL/HDL Ratio 5.8 RATIO   VLDL 19 0 - 40 mg/dL   LDL Cholesterol 92 0 - 99 mg/dL   Dg Chest 2 View  Result Date: 11/29/2019 CLINICAL DATA:  Mild epigastric pain and shortness of breath 1 week. EXAM: CHEST - 2 VIEW COMPARISON:  None. FINDINGS: Lungs  are adequately inflated demonstrate a small to moderate size right pleural effusion likely with associated basilar atelectasis. Left lung is clear. Mild cardiomegaly. Mild degenerate change of the spine. IMPRESSION: Moderate size right pleural effusion likely with associated basilar atelectasis. Mild cardiomegaly. Electronically Signed   By: Elberta Fortis M.D.   On: 11/29/2019 09:36   US Abdomen Limited Ruq  Result Date: 11/29/2019 CLINICAL DATA:  Right upper quadrant abdominal pain. EXAM: ULTRASOUND ABDOMEN LIMITED RIGHT UPPER QUADRANT COMPARISON:  February 07, 2013. FINDINGS: Gallbladder: No gallstones or wall thickening visualized. No sonographic Murphy sign noted by sonographer. Common bile duct: Diameter: 2 mm which is within normal limits. Liver: No focal lesion identified. Within normal limits in parenchymal echogenicity. Portal vein is patent on color Doppler imaging with normal direction of blood flow towards the liver. Other: Minimal ascites is noted. IMPRESSION: Minimal ascites. No other significant abnormality seen in the right upper quadrant of the abdomen. Electronically Signed   By: Lupita Raider M.D.   On: 11/29/2019 11:53     ASSESSMENT AND PLAN: Congestive heart failure most likely due to diastolic dysfunction with echocardiogram pending.  Patient is already on ACE inhibitor's and Lasix IV and is still short of breath.  Will add Aldactone 25 mg since is been shown to decrease hospitalization and diastolic dysfunction.  Continue rest of the medications.  Advise watching potassium and BUN/creatinine and if the creatinine goes up to 2.5 will have to hold Aldactone and if potassium goes up beyond 5 we will have to hold Aldactone.  , A

## 2019-11-30 NOTE — Evaluation (Signed)
Physical Therapy Evaluation Patient Details Name: Reginald Hernandez MRN: [id number] DOB: [birth year] Today's Date: 11/30/2019   History of Present Illness  Pt is a 53 year old white male with a past medical history of smoking, renal stones, presented to the hospital with few days onset of shortness of breath orthopnea PND and leg swelling.    Clinical Impression  Patient alert, initially sitting in bed, when PT returned with O2 tank pt was ambulating out of bathroom without AD. Ambulation distance limited due to elevated BP and pt reported SOB. The patient was able to ambulate to bathroom to bed, and bed to door during sesion independently, no gross deficits in UE or LE strength. The pt did exhibit mild SOB on room air, pt had removed [place] at start of mobility, spO2 readings unclear. Pt educated on PT role, goals, and benefits of continued therapy to address activity modification and energy conservation. PT recommendation is outpatient PT.     Follow Up Recommendations Outpatient PT    Equipment Recommendations  None recommended by PT    Recommendations for Other Services       Precautions / Restrictions Precautions Precautions: Fall Restrictions Weight Bearing Restrictions: No      Mobility  Bed Mobility Overal bed mobility: Independent                Transfers Overall transfer level: Independent                  Ambulation/Gait Ambulation/Gait assistance: Independent Gait Distance (Feet): 20 Feet         General Gait Details: Ambulation distance limited due to elevated BP and pt reported SOB. The patient was able to ambulate to bathroom to bed, and bed to door during sesion independelty. Did exhibit mild SOB on room air, spO2 readings unclear.  Stairs            Wheelchair Mobility    Modified Rankin (Stroke Patients Only)       Balance Overall balance assessment: Mild deficits observed, not formally tested                                            Pertinent Vitals/Pain Pain Assessment: No/denies pain    Home Living Family/patient expects to be discharged to:: Private residence Living Arrangements: Parent Available Help at Discharge: Family Type of Home: House Home Access: Stairs to enter Entrance Stairs-Rails: Left Entrance Stairs-Number of Steps: 3-4 Home Layout: One level Home Equipment: Environmental consultant - 2 wheels;Walker - 4 wheels;Shower seat Additional Comments: pt does not use AD at baseline    Prior Function Level of Independence: Independent         Comments: Pt takes care of his mother     Hand Dominance        Extremity/Trunk Assessment   Upper Extremity Assessment Upper Extremity Assessment: Overall WFL for tasks assessed    Lower Extremity Assessment Lower Extremity Assessment: Overall WFL for tasks assessed    Cervical / Trunk Assessment Cervical / Trunk Assessment: Normal  Communication   Communication: No difficulties  Cognition Arousal/Alertness: Awake/alert Behavior During Therapy: WFL for tasks assessed/performed Overall Cognitive Status: Within Functional Limits for tasks assessed  General Comments      Exercises Other Exercises Other Exercises: Pt instructed in PLB, PT role, benefit of continued therapy to address energy conservation, activity modification   Assessment/Plan    PT Assessment Patient needs continued PT services  PT Problem List Cardiopulmonary status limiting activity;Decreased activity tolerance;Decreased mobility       PT Treatment Interventions Balance training;Gait training;Neuromuscular re-education;Stair training;Functional mobility training;Patient/family education;Therapeutic activities;Therapeutic exercise    PT Goals (Current goals can be found in the Care Plan section)  Acute Rehab PT Goals Patient Stated Goal: to go home PT Goal Formulation: With patient Time For Goal  Achievement: 12/14/19 Potential to Achieve Goals: Good    Frequency Min 2X/week   Barriers to discharge        Co-evaluation               AM-PAC PT "6 Clicks" Mobility  Outcome Measure Help needed turning from your back to your side while in a flat bed without using bedrails?: None Help needed moving from lying on your back to sitting on the side of a flat bed without using bedrails?: None Help needed moving to and from a bed to a chair (including a wheelchair)?: None Help needed standing up from a chair using your arms (e.g., wheelchair or bedside chair)?: None Help needed to walk in hospital room?: None Help needed climbing 3-5 steps with a railing? : A Little 6 Click Score: 23    End of Session Equipment Utilized During Treatment: Gait belt Activity Tolerance: Patient tolerated treatment well Patient left: in bed;with call bell/phone within reach Nurse Communication: Mobility status PT Visit Diagnosis: Other abnormalities of gait and mobility (R26.89)    Time: 0347-4259 PT Time Calculation (min) (ACUTE ONLY): 17 min   Charges:   PT Evaluation $PT Eval Low Complexity: 1 Low          Olga Coaster PT, DPT 12:02 PM,11/30/19 [phone]

## 2019-12-01 DIAGNOSIS — I5023 Acute on chronic systolic (congestive) heart failure: Principal | ICD-10-CM

## 2019-12-01 LAB — BASIC METABOLIC PANEL
Anion gap: 11 (ref 5–15)
BUN: 24 mg/dL — ABNORMAL HIGH (ref 6–20)
CO2: 26 mmol/L (ref 22–32)
Calcium: 8.6 mg/dL — ABNORMAL LOW (ref 8.9–10.3)
Chloride: 102 mmol/L (ref 98–111)
Creatinine, Ser: 1.28 mg/dL — ABNORMAL HIGH (ref 0.61–1.24)
GFR calc Af Amer: >60 mL/min (ref >60)
GFR calc non Af Amer: >60 mL/min (ref >60)
Glucose, Bld: 154 mg/dL — ABNORMAL HIGH (ref 70–99)
Potassium: 3.5 mmol/L (ref 3.5–5.1)
Sodium: 139 mmol/L (ref 135–145)

## 2019-12-01 LAB — HEMOGLOBIN A1C
Hgb A1c MFr Bld: 7.1 % — ABNORMAL HIGH (ref 4.8–5.6)
Mean Plasma Glucose: 157.07 mg/dL

## 2019-12-01 MED ORDER — FUROSEMIDE 40 MG PO TABS: 40.0000 mg | ORAL_TABLET | Freq: Every day | ORAL | 0 refills | Status: DC

## 2019-12-01 MED ORDER — TORSEMIDE 20 MG PO TABS: 20.0000 mg | ORAL_TABLET | Freq: Every day | ORAL | Status: DC

## 2019-12-01 MED ORDER — SPIRONOLACTONE 25 MG PO TABS: 25.0000 mg | ORAL_TABLET | Freq: Every day | ORAL | 0 refills | Status: DC

## 2019-12-01 MED ORDER — SACUBITRIL-VALSARTAN 24-26 MG PO TABS: 1.0000 | ORAL_TABLET | Freq: Two times a day (BID) | ORAL | 0 refills | Status: AC

## 2019-12-01 MED ORDER — ASPIRIN 81 MG PO TBEC: 81.0000 mg | DELAYED_RELEASE_TABLET | Freq: Every day | ORAL | 0 refills | Status: AC

## 2019-12-01 MED ORDER — SACUBITRIL-VALSARTAN 24-26 MG PO TABS: 1.0000 | ORAL_TABLET | Freq: Two times a day (BID) | ORAL | 0 refills | Status: DC

## 2019-12-01 MED ORDER — CARVEDILOL 6.25 MG PO TABS: 6.2500 mg | ORAL_TABLET | Freq: Two times a day (BID) | ORAL | 0 refills | Status: DC

## 2019-12-01 NOTE — Progress Notes (Signed)
Discharge instructions explained to pt/ verbalized an understanding/ iv and tele removed/ CHF and diet education explained to pt/ will transport off unit via wheelchair.

## 2019-12-01 NOTE — Progress Notes (Signed)
SUBJECTIVE: Pt is feeling well. No chest pain, no shortness of breath.   Vitals:   11/30/19 1954 11/30/19 2347 12/01/19 0403 12/01/19 0735  BP: 114/83  114/85 (!) 121/92  Pulse: (!) 107 91 97 95  Resp: 20  20 18   Temp: 97.6 F (36.4 C)  98.1 F (36.7 C) 97.9 F (36.6 C)  TempSrc: Oral  Oral Oral  SpO2: 99%  98% 95%  Weight:   88.5 kg   Height:        Intake/Output Summary (Last 24 hours) at 12/01/2019 1024 Last data filed at 12/01/2019 1021 Gross per 24 hour  Intake 480 ml  Output 0 ml  Net 480 ml    LABS: Basic Metabolic Panel: Recent Labs    11/30/19 0557 12/01/19 0449  NA 139 139  K 4.0 3.5  CL 105 102  CO2 24 26  GLUCOSE 142* 154*  BUN 17 24*  CREATININE 1.06 1.28*  CALCIUM 8.5* 8.6*   Liver Function Tests: Recent Labs    11/29/19 0900  AST 23  ALT 19  ALKPHOS 78  BILITOT 0.7  PROT 7.2  ALBUMIN 3.7   Recent Labs    11/29/19 0900  LIPASE 21   CBC: Recent Labs    11/29/19 0900  WBC 10.0  HGB 14.1  HCT 42.7  MCV 89.3  PLT 240   Cardiac Enzymes: No results for input(s): CKTOTAL, CKMB, CKMBINDEX, TROPONINI in the last 72 hours. BNP: Invalid input(s): POCBNP D-Dimer: No results for input(s): DDIMER in the last 72 hours. Hemoglobin A1C: No results for input(s): HGBA1C in the last 72 hours. Fasting Lipid Panel: Recent Labs    11/30/19 0557  CHOL 134  HDL 23*  LDLCALC 92  TRIG 93  CHOLHDL 5.8   Thyroid Function Tests: No results for input(s): TSH, T4TOTAL, T3FREE, THYROIDAB in the last 72 hours.  Invalid input(s): FREET3 Anemia Panel: No results for input(s): VITAMINB12, FOLATE, FERRITIN, TIBC, IRON, RETICCTPCT in the last 72 hours.   PHYSICAL EXAM General: Well developed, well nourished, in no acute distress HEENT:  Normocephalic and atramatic Neck:  No JVD.  Lungs: Clear bilaterally to auscultation and percussion. Heart: HRRR . Normal S1 and S2 without gallops or murmurs.  Abdomen: Bowel sounds are positive, abdomen soft  and non-tender  Msk:  Back normal, normal gait. Normal strength and tone for age. Extremities: No clubbing, cyanosis or edema.   Neuro: Alert and oriented X 3. Psych:  Good affect, responds appropriately  TELEMETRY: NSR 95bpm  ASSESSMENT AND PLAN:   HF with reduced EF: Feeling better today. No significant shortness of breath and eager to go home. Advise continuing Entresto, spironolactone, coreg, and lasix 40mg /day.  Follow up outpatient cardiology with Dr. Humphrey Rolls at [facility] Monday 10am for continued management of CHF.   Echo Results :LVEF is 20 to 25%. Mild LVH and Grade II diastolic dysfunction (pseudonormalization).  Principal Problem:   Acute CHF (congestive heart failure) (HCC) Active Problems:   Epigastric abdominal pain   Elevated troponin   Tobacco abuse   Acute systolic CHF (congestive heart failure) ([place])   Polysubstance abuse ([place])   Cardiomyopathy ([place])    Jake Bathe, NP-C 12/01/2019 10:24 AM Cell: [phone]

## 2019-12-01 NOTE — Discharge Summary (Signed)
[facility] [place] [facility]   PATIENT NAME: Reginald Hernandez    MR#:  [id number]  DATE OF BIRTH:  1966/04/01  DATE OF ADMISSION:  11/29/2019 ADMITTING PHYSICIAN: Lorretta Harp, MD  DATE OF DISCHARGE: 12/01/2019  PRIMARY CARE PHYSICIAN: [facility]   ADMISSION DIAGNOSIS:  Pleural effusion [J90] RUQ pain [R10.11] Acute congestive heart failure, unspecified heart failure type (HCC) [I50.9]  DISCHARGE DIAGNOSIS:  Acute systolic congestive heart failure  SECONDARY DIAGNOSIS:   Past Medical History:  Diagnosis Date  . Renal disorder    kidney stones    HOSPITAL COURSE:   1.  Acute on chronic systolic congestive heart failure.  Patient has severe cardiomyopathy with an EF of 20 to 25%.  Patient was diuresed with IV Lasix during the hospital course.  The patient was started on low-dose Entresto.  Transition coordinator gave a coupon for this.  Other medications were through medication management. Lasix, Coreg and Aldactone.  Patient to weigh himself with daily weights.  Recommend checking a BMP and follow-up appointment. 2.  Type 2 diabetes mellitus.  Hemoglobin A1c 7.1 so the patient is a diabetic.  Can do this with diet at this time.  Low carbohydrate diet discussed. 3.  Polysubstance abuse.  My associate discussed cessation of marijuana and amphetamines with him.  DISCHARGE CONDITIONS:   Satisfactory  CONSULTS OBTAINED:  Treatment Team:  Laurier Nancy, MD  DRUG ALLERGIES:  No Known Allergies  DISCHARGE MEDICATIONS:   Allergies as of 12/01/2019   No Known Allergies     Medication List    TAKE these medications   aspirin 81 MG EC tablet Take 1 tablet (81 mg total) by mouth daily. Start taking on: December 02, 2019   carvedilol 6.25 MG tablet Commonly known as: COREG Take 1 tablet (6.25 mg total) by mouth 2 (two) times daily with a meal.   furosemide 40 MG tablet Commonly known as: Lasix Take 1 tablet (40 mg total) by mouth daily.    sacubitril-valsartan 24-26 MG Commonly known as: ENTRESTO Take 1 tablet by mouth 2 (two) times daily.   spironolactone 25 MG tablet Commonly known as: ALDACTONE Take 1 tablet (25 mg total) by mouth daily. Start taking on: December 02, 2019        DISCHARGE INSTRUCTIONS:   Follow-up PMD 2 weeks Follow-up cardiology 1 week  If you experience worsening of your admission symptoms, develop shortness of breath, life threatening emergency, suicidal or homicidal thoughts you must seek medical attention immediately by calling 911 or calling your MD immediately  if symptoms less severe.  You Must read complete instructions/literature along with all the possible adverse reactions/side effects for all the Medicines you take and that have been prescribed to you. Take any new Medicines after you have completely understood and accept all the possible adverse reactions/side effects.   Please note  You were cared for by a hospitalist during your hospital stay. If you have any questions about your discharge medications or the care you received while you were in the hospital after you are discharged, you can call the unit and asked to speak with the hospitalist on call if the hospitalist that took care of you is not available. Once you are discharged, your primary care physician will handle any further medical issues. Please note that NO REFILLS for any discharge medications will be authorized once you are discharged, as it is imperative that you return to your primary care physician (or establish a relationship with a  primary care physician if you do not have one) for your aftercare needs so that they can reassess your need for medications and monitor your lab values.    Today   CHIEF COMPLAINT:   Chief Complaint  Patient presents with  . Abdominal Pain  . Shortness of Breath    HISTORY OF PRESENT ILLNESS:  Reginald Hernandez  is a 53 y.o. male came in with shortness of breath   VITAL SIGNS:   Blood pressure (!) 121/92, pulse 95, temperature 97.9 F (36.6 C), temperature source Oral, resp. rate 18, height 5\' 8"  (1.727 m), weight 88.5 kg, SpO2 95 %.  I/O:    Intake/Output Summary (Last 24 hours) at 12/01/2019 1340 Last data filed at 12/01/2019 1331 Gross per 24 hour  Intake 480 ml  Output 0 ml  Net 480 ml    PHYSICAL EXAMINATION:  GENERAL:  53 y.o.-year-old patient lying in the bed with no acute distress.  EYES: Pupils equal, round, reactive to light and accommodation. No scleral icterus. Extraocular muscles intact.  HEENT: Head atraumatic, normocephalic. Oropharynx and nasopharynx clear.  NECK:  Supple, no jugular venous distention. No thyroid enlargement, no tenderness.  LUNGS: Decreased breath sounds bilateral base, no wheezing, rales,rhonchi or crepitation. No use of accessory muscles of respiration.  CARDIOVASCULAR: S1, S2 normal. No murmurs, rubs, or gallops.  ABDOMEN: Soft, non-tender, non-distended. Bowel sounds present. No organomegaly or mass.  EXTREMITIES: No pedal edema, cyanosis, or clubbing.  NEUROLOGIC: Cranial nerves II through XII are intact. Muscle strength 5/5 in all extremities. Sensation intact. Gait not checked.  PSYCHIATRIC: The patient is alert and oriented x 3.  SKIN: No obvious rash, lesion, or ulcer.   DATA REVIEW:   CBC Recent Labs  Lab 11/29/19 0900  WBC 10.0  HGB 14.1  HCT 42.7  PLT 240    Chemistries  Recent Labs  Lab 11/29/19 0900  12/01/19 0449  NA 140   < > 139  K 3.7   < > 3.5  CL 106   < > 102  CO2 24   < > 26  GLUCOSE 159*   < > 154*  BUN 15   < > 24*  CREATININE 1.15   < > 1.28*  CALCIUM 8.8*   < > 8.6*  AST 23  --   --   ALT 19  --   --   ALKPHOS 78  --   --   BILITOT 0.7  --   --    < > = values in this interval not displayed.    Microbiology Results  Results for orders placed or performed during the hospital encounter of 11/29/19  SARS CORONAVIRUS 2 (TAT 6-24 HRS) Nasopharyngeal Nasopharyngeal Swab      Status: None   Collection Time: 11/29/19  1:21 PM   Specimen: Nasopharyngeal Swab  Result Value Ref Range Status   SARS Coronavirus 2 NEGATIVE NEGATIVE Final    Comment: (NOTE) SARS-CoV-2 target nucleic acids are NOT DETECTED. The SARS-CoV-2 RNA is generally detectable in upper and lower respiratory specimens during the acute phase of infection. Negative results do not preclude SARS-CoV-2 infection, do not rule out co-infections with other pathogens, and should not be used as the sole basis for treatment or other patient management decisions. Negative results must be combined with clinical observations, patient history, and epidemiological information. The expected result is Negative. Fact Sheet for Patients: 14/07/20 Fact Sheet for Healthcare Providers: HairSlick.no This test is not yet approved or cleared by the quierodirigir.com  States FDA and  has been authorized for detection and/or diagnosis of SARS-CoV-2 by FDA under an Emergency Use Authorization (EUA). This EUA will remain  in effect (meaning this test can be used) for the duration of the COVID-19 declaration under Section 56 4(b)(1) of the Act, 21 U.S.C. section 360bbb-3(b)(1), unless the authorization is terminated or revoked sooner. Performed at [facility], [place] [address] [place], [place] [address]       Management plans discussed with the patient, and he is in agreement.  CODE STATUS:     Code Status Orders  (From admission, onward)         Start     Ordered   11/29/19 1936  Full code  Continuous     11/29/19 1935        Code Status History    This patient has a current code status but no historical code status.   Advance Care Planning Activity      TOTAL TIME TAKING CARE OF THIS PATIENT: 35 minutes.    Loletha Grayer M.D on 12/01/2019 at 1:40 PM  Between 7am to 6pm - Pager - [phone]  After 6pm go to [email or web address] - password  [email or web address]  [facility]  CC: Primary care physician; [facility].

## 2019-12-01 NOTE — TOC Transition Note (Signed)
Transition of Care [facility]) - CM/SW Discharge Note   Patient Details  Name: Reginald Hernandez MRN: [id number] Date of Birth: 1966-12-08  [facility] CM/SW Contact:  Ross Ludwig, LCSW Phone Number: 12/01/2019, 12:39 PM   Clinical Narrative:    CSW received consult that patient needed assistance with medications, patient does not have insurance.  CSW spoke to patient and sent his medications to med management clinic.  Patient was offered substance abuse resources, he did not seem interested.  CSW also provided patient with an Entresto coupon for 30 days free, and also a book which has contacts for PCPs if patient does not have insurance.  Patient did not express any interest in outpatient PT, and did not have any other concerns or issues.  CSW informed patient that the med management clinic is closed from 12:30-1:30pm, and clinic closes at 4:30pm today in order for him to get his meds.   Final next level of care: Home/Self Care Barriers to Discharge: Barriers Resolved   Patient Goals and CMS Choice Patient states their goals for this hospitalization and ongoing recovery are:: To return back home   Choice offered to / list presented to : NA  Discharge Placement  Patient will be returning back home.                     Discharge Plan and Services    Patient to return back home after discharge, medications are available at medication management clinic.                        [place] Agency: NA        Social Determinants of Health (SDOH) Interventions     Readmission Risk Interventions No flowsheet data found.

## 2019-12-10 ENCOUNTER — Ambulatory Visit: Payer: Self-pay | Admitting: Family

## 2019-12-10 ENCOUNTER — Telehealth: Payer: Self-pay | Admitting: Family

## 2019-12-10 NOTE — Telephone Encounter (Signed)
Patient did not show for his Heart Failure Clinic appointment on 12/10/2019. Will attempt to reschedule.

## 2019-12-22 ENCOUNTER — Telehealth: Payer: Self-pay | Admitting: Family

## 2019-12-22 NOTE — Telephone Encounter (Signed)
LVM with pt to attempt to reschedule his missed new patient appt with [place] for heart failure from 12/18.     Alyse Low, [place]

## 2019-12-27 NOTE — Progress Notes (Signed)
Patient ID: Reginald Hernandez, male    DOB: [birth year], 55 y.o.   MRN: [id number]  HPI  Mr Nicolini is a 54 y/o male with a history of HTN, CKD, current tobacco use and chronic heart failure.   Echo report from 11/30/2019 reviewed and showed an EF of 20-25% along with mild MR/TR/PR, moderate aortic stenosis and mildly elevated PA pressure.  Admitted 11/29/2019 due to acute on chronic HF. Cardiology consult obtained. Initially given IV lasix and then transitioned to oral diuretics. Discharged after 2 days.   He presents today for his initial visit with a chief complaint of minimal shortness of breath upon moderate exertion. He describes this as chronic in nature having been present for several years. He has associated fatigue, intermittent chest pain (after doing strenuous work), palpations and back pain along with this. He denies any difficulty sleeping, dizziness, abdominal distention, pedal edema, cough or weight gain.   Past Medical History:  Diagnosis Date  . CHF (congestive heart failure) (HCC)   . Hypertension   . Renal disorder    kidney stones   Past Surgical History:  Procedure Laterality Date  . TONSILLECTOMY     Family History  Problem Relation Age of Onset  . Diabetes Mellitus II Mother   . Heart disease Father    Social History   Tobacco Use  . Smoking status: Current Every Day Smoker    Packs/day: 0.50  . Smokeless tobacco: Never Used  Substance Use Topics  . Alcohol use: No   No Known Allergies Prior to Admission medications   Medication Sig Start Date End Date Taking? Authorizing Provider  aspirin EC 81 MG EC tablet Take 1 tablet (81 mg total) by mouth daily. 12/02/19  Yes Alford Highland, MD  carvedilol (COREG) 6.25 MG tablet Take 1 tablet (6.25 mg total) by mouth 2 (two) times daily with a meal. 12/01/19  Yes Wieting, Richard, MD  furosemide (LASIX) 40 MG tablet Take 1 tablet (40 mg total) by mouth daily. 12/01/19  Yes Wieting, Richard, MD   sacubitril-valsartan (ENTRESTO) 24-26 MG Take 1 tablet by mouth 2 (two) times daily. 12/01/19  Yes Wieting, Richard, MD  spironolactone (ALDACTONE) 25 MG tablet Take 1 tablet (25 mg total) by mouth daily. 12/02/19  Yes Alford Highland, MD     Review of Systems  Constitutional: Positive for fatigue. Negative for appetite change.  HENT: Positive for nosebleeds and rhinorrhea. Negative for congestion and sore throat.   Eyes: Negative.   Respiratory: Positive for shortness of breath (with strenous exertion). Negative for cough.   Cardiovascular: Positive for chest pain (with strenous exertion) and palpitations. Negative for leg swelling.  Gastrointestinal: Negative for abdominal distention and abdominal pain.  Endocrine: Negative.   Genitourinary: Negative.   Musculoskeletal: Positive for back pain (at times). Negative for neck pain.  Skin: Negative.   Allergic/Immunologic: Negative.   Neurological: Negative for dizziness and light-headedness.  Hematological: Negative for adenopathy. Does not bruise/bleed easily.  Psychiatric/Behavioral: Negative for dysphoric mood and sleep disturbance (sleeping on 2 pillows). The patient is not nervous/anxious.     Vitals:   12/28/19 0832  BP: (!) 125/95  Pulse: 95  Resp: 18  SpO2: 100%  Weight: 192 lb 9.6 oz (87.4 kg)  Height: 5\' 7"  (1.702 m)   Wt Readings from Last 3 Encounters:  12/28/19 192 lb 9.6 oz (87.4 kg)  12/01/19 195 lb 1.6 oz (88.5 kg)  04/06/16 176 lb (79.8 kg)   Lab Results  Component Value Date  CREATININE 1.28 (H) 12/01/2019   CREATININE 1.06 11/30/2019   CREATININE 1.15 11/29/2019     Physical Exam Vitals and nursing note reviewed.  Constitutional:      Appearance: Normal appearance.  HENT:     Head: Normocephalic and atraumatic.  Cardiovascular:     Rate and Rhythm: Normal rate and regular rhythm.  Pulmonary:     Effort: Pulmonary effort is normal. No respiratory distress.     Breath sounds: No wheezing.   Abdominal:     General: There is no distension.     Palpations: Abdomen is soft.  Musculoskeletal:        General: No tenderness.     Cervical back: Normal range of motion and neck supple.     Right lower leg: No edema.     Left lower leg: No edema.  Skin:    General: Skin is warm and dry.  Neurological:     General: No focal deficit present.     Mental Status: He is alert and oriented to person, place, and time.  Psychiatric:        Mood and Affect: Mood normal.        Behavior: Behavior normal.        Thought Content: Thought content normal.      Assessment & Plan:   1: Chronic heart failure with reduced ejection fraction- - NYHA class II - euvolemic today - weighing daily and says that his weight has been stable; instructed to call for an overnight weight gain of >2 pounds or a weekly weight gain of >5 pounds - not adding salt and trying to look at food labels; reviewed the importance of following a low sodium diet and written dietary information along with a low sodium cookbook were given to the patient - BNP 11/29/2019 was 1995.0 - he's unsure if he's received the flu vaccine or not - would like to increase entresto but he still has to fill out paperwork for medication clinic; novart[employer] application filled out for entresto   2: HTN- - BP mildly elevated today - contact information given to him for the [facility] and [place] 12/01/2019 reviewed and showed sodium 139, potassium 3.5, creatinine 1.28 and GFR >60  3: Tobacco use-  - smokes 1/2 ppd of cigarettes and "occasionally" uses amphetamines - complete cessation of both discussed for 3 minutes with him  Medication list was reviewed.   Return in 1 month or sooner for any questions/problems before then.

## 2019-12-28 ENCOUNTER — Other Ambulatory Visit: Payer: Self-pay

## 2019-12-28 ENCOUNTER — Ambulatory Visit: Payer: Self-pay | Attending: Family | Admitting: Family

## 2019-12-28 ENCOUNTER — Encounter: Payer: Self-pay | Admitting: Family

## 2019-12-28 VITALS — BP 125/95 | HR 95 | Resp 18 | Ht 67.0 in | Wt 192.6 lb

## 2019-12-28 DIAGNOSIS — Z79899 Other long term (current) drug therapy: Secondary | ICD-10-CM | POA: Insufficient documentation

## 2019-12-28 DIAGNOSIS — Z8249 Family history of ischemic heart disease and other diseases of the circulatory system: Secondary | ICD-10-CM | POA: Insufficient documentation

## 2019-12-28 DIAGNOSIS — I350 Nonrheumatic aortic (valve) stenosis: Secondary | ICD-10-CM | POA: Insufficient documentation

## 2019-12-28 DIAGNOSIS — Z87442 Personal history of urinary calculi: Secondary | ICD-10-CM | POA: Insufficient documentation

## 2019-12-28 DIAGNOSIS — I10 Essential (primary) hypertension: Secondary | ICD-10-CM

## 2019-12-28 DIAGNOSIS — Z720 Tobacco use: Secondary | ICD-10-CM

## 2019-12-28 DIAGNOSIS — N189 Chronic kidney disease, unspecified: Secondary | ICD-10-CM | POA: Insufficient documentation

## 2019-12-28 DIAGNOSIS — I5022 Chronic systolic (congestive) heart failure: Secondary | ICD-10-CM

## 2019-12-28 DIAGNOSIS — I130 Hypertensive heart and chronic kidney disease with heart failure and stage 1 through stage 4 chronic kidney disease, or unspecified chronic kidney disease: Secondary | ICD-10-CM | POA: Insufficient documentation

## 2019-12-28 DIAGNOSIS — Z7982 Long term (current) use of aspirin: Secondary | ICD-10-CM | POA: Insufficient documentation

## 2019-12-28 DIAGNOSIS — F17210 Nicotine dependence, cigarettes, uncomplicated: Secondary | ICD-10-CM | POA: Insufficient documentation

## 2019-12-28 NOTE — Patient Instructions (Addendum)
Continue weighing daily and call for an overnight weight gain of > 2 pounds or a weekly weight gain of >5 pounds.  [facility] Address: [address] [place], [place] [address] Phone: [phone]

## 2020-01-21 ENCOUNTER — Ambulatory Visit
Admission: RE | Admit: 2020-01-21 | Discharge: 2020-01-21 | Disposition: A | Discharge status: Home / Self Care | Payer: Self-pay | Source: Ambulatory Visit | Attending: Family | Admitting: Family

## 2020-01-21 ENCOUNTER — Other Ambulatory Visit: Payer: Self-pay | Admitting: Family

## 2020-01-21 ENCOUNTER — Ambulatory Visit: Payer: Self-pay | Admitting: Family

## 2020-01-21 ENCOUNTER — Other Ambulatory Visit: Payer: Self-pay

## 2020-01-21 ENCOUNTER — Encounter: Payer: Self-pay | Admitting: Family

## 2020-01-21 VITALS — BP 148/105 | HR 101 | Resp 18 | Ht 68.0 in | Wt 206.8 lb

## 2020-01-21 DIAGNOSIS — Z8249 Family history of ischemic heart disease and other diseases of the circulatory system: Secondary | ICD-10-CM | POA: Insufficient documentation

## 2020-01-21 DIAGNOSIS — I5023 Acute on chronic systolic (congestive) heart failure: Secondary | ICD-10-CM

## 2020-01-21 DIAGNOSIS — F17210 Nicotine dependence, cigarettes, uncomplicated: Secondary | ICD-10-CM | POA: Insufficient documentation

## 2020-01-21 DIAGNOSIS — N189 Chronic kidney disease, unspecified: Secondary | ICD-10-CM | POA: Insufficient documentation

## 2020-01-21 DIAGNOSIS — Z87442 Personal history of urinary calculi: Secondary | ICD-10-CM | POA: Insufficient documentation

## 2020-01-21 DIAGNOSIS — Z720 Tobacco use: Secondary | ICD-10-CM

## 2020-01-21 DIAGNOSIS — I130 Hypertensive heart and chronic kidney disease with heart failure and stage 1 through stage 4 chronic kidney disease, or unspecified chronic kidney disease: Secondary | ICD-10-CM | POA: Insufficient documentation

## 2020-01-21 DIAGNOSIS — Z7982 Long term (current) use of aspirin: Secondary | ICD-10-CM | POA: Insufficient documentation

## 2020-01-21 DIAGNOSIS — Z79899 Other long term (current) drug therapy: Secondary | ICD-10-CM | POA: Insufficient documentation

## 2020-01-21 DIAGNOSIS — Z833 Family history of diabetes mellitus: Secondary | ICD-10-CM | POA: Insufficient documentation

## 2020-01-21 DIAGNOSIS — I10 Essential (primary) hypertension: Secondary | ICD-10-CM

## 2020-01-21 LAB — BASIC METABOLIC PANEL
Anion gap: 8 (ref 5–15)
BUN: 15 mg/dL (ref 6–20)
CO2: 24 mmol/L (ref 22–32)
Calcium: 8.5 mg/dL — ABNORMAL LOW (ref 8.9–10.3)
Chloride: 107 mmol/L (ref 98–111)
Creatinine, Ser: 1.15 mg/dL (ref 0.61–1.24)
GFR calc Af Amer: >60 mL/min (ref >60)
GFR calc non Af Amer: >60 mL/min (ref >60)
Glucose, Bld: 145 mg/dL — ABNORMAL HIGH (ref 70–99)
Potassium: 3.9 mmol/L (ref 3.5–5.1)
Sodium: 139 mmol/L (ref 135–145)

## 2020-01-21 LAB — BRAIN NATRIURETIC PEPTIDE: B Natriuretic Peptide: 2525 pg/mL — ABNORMAL HIGH (ref 0.0–100.0)

## 2020-01-21 MED ORDER — CARVEDILOL 6.25 MG PO TABS: 6.2500 mg | ORAL_TABLET | Freq: Two times a day (BID) | ORAL | 5 refills | Status: AC

## 2020-01-21 MED ORDER — FUROSEMIDE 40 MG PO TABS: 40.0000 mg | ORAL_TABLET | Freq: Every day | ORAL | 5 refills | Status: AC

## 2020-01-21 MED ORDER — POTASSIUM CHLORIDE CRYS ER 20 MEQ PO TBCR
40.0000 meq | EXTENDED_RELEASE_TABLET | Freq: Once | ORAL | Status: AC
  Administered 2020-01-21: 40 meq via ORAL

## 2020-01-21 MED ORDER — SPIRONOLACTONE 25 MG PO TABS: 25.0000 mg | ORAL_TABLET | Freq: Every day | ORAL | 5 refills | Status: AC

## 2020-01-21 MED ORDER — FUROSEMIDE 10 MG/ML IJ SOLN
80.0000 mg | Freq: Once | INTRAMUSCULAR | Status: AC
  Administered 2020-01-21: 14:00:00 80 mg via INTRAVENOUS

## 2020-01-21 NOTE — Patient Instructions (Addendum)
Continue weighing daily and call for an overnight weight gain of > 2 pounds or a weekly weight gain of >5 pounds.  Pick up the following medications:  Furosemide (lasix) Spironolactone Carvedilol (coreg)

## 2020-01-21 NOTE — Progress Notes (Signed)
Patient ID: Reginald Hernandez, male    DOB: 09/16/1966, 54 y.o.   MRN: [id number]  HPI  Reginald Hernandez is a 54 y/o male with a history of HTN, CKD, current tobacco use and chronic heart failure.   Echo report from 11/30/2019 reviewed and showed an EF of 20-25% along with mild Reginald/TR/PR, moderate aortic stenosis and mildly elevated PA pressure.  Admitted 11/29/2019 due to acute on chronic HF. Cardiology consult obtained. Initially given IV lasix and then transitioned to oral diuretics. Discharged after 2 days.   He presents today for a follow-up visit with a chief complaint of moderate shortness of breath upon minimal exertion. He describes this as chronic in nature having been present for several years although he says it has worsened quite a bit over the last 1-2 weeks. He has associated fatigue, chest pain, pedal edema, abdominal distention, difficulty sleeping and weight gain along with this. He denies any palpitations, dizziness or cough.   He says that he ran out of all his medications a few weeks ago and they didn't have any refills on them. He says that he was doing fine until the last 1-2 weeks  Past Medical History:  Diagnosis Date  . CHF (congestive heart failure) ([place])   . Hypertension   . Renal disorder    kidney stones   Past Surgical History:  Procedure Laterality Date  . TONSILLECTOMY     Family History  Problem Relation Age of Onset  . Diabetes Mellitus II Mother   . Heart disease Father    Social History   Tobacco Use  . Smoking status: Current Every Day Smoker    Packs/day: 0.50  . Smokeless tobacco: Never Used  Substance Use Topics  . Alcohol use: No   No Known Allergies  Prior to Admission medications   Medication Sig Start Date End Date Taking? Authorizing Provider  aspirin EC 81 MG EC tablet Take 1 tablet (81 mg total) by mouth daily. 12/02/19  Yes Loletha Grayer, MD  carvedilol (COREG) 6.25 MG tablet Take 1 tablet (6.25 mg total) by mouth 2 (two) times  daily with a meal. Patient not taking: Reported on 01/21/2020 12/01/19   Loletha Grayer, MD  furosemide (LASIX) 40 MG tablet Take 1 tablet (40 mg total) by mouth daily. Patient not taking: Reported on 01/21/2020 12/01/19   Loletha Grayer, MD  sacubitril-valsartan (ENTRESTO) 24-26 MG Take 1 tablet by mouth 2 (two) times daily. Patient not taking: Reported on 01/21/2020 12/01/19   Loletha Grayer, MD  spironolactone (ALDACTONE) 25 MG tablet Take 1 tablet (25 mg total) by mouth daily. Patient not taking: Reported on 01/21/2020 12/02/19   Loletha Grayer, MD    Review of Systems  Constitutional: Positive for fatigue. Negative for appetite change.  HENT: Negative for congestion, nosebleeds, rhinorrhea and sore throat.   Eyes: Negative.   Respiratory: Positive for shortness of breath (with little exertion). Negative for cough.   Cardiovascular: Positive for chest pain (with strenous exertion) and leg swelling. Negative for palpitations.  Gastrointestinal: Positive for abdominal distention. Negative for abdominal pain.  Endocrine: Negative.   Genitourinary: Negative.   Musculoskeletal: Positive for back pain (at times). Negative for neck pain.  Skin: Negative.   Allergic/Immunologic: Negative.   Neurological: Negative for dizziness and light-headedness.  Hematological: Negative for adenopathy. Does not bruise/bleed easily.  Psychiatric/Behavioral: Positive for sleep disturbance (sleeping on 2 pillows). Negative for dysphoric mood. The patient is not nervous/anxious.    Vitals:   01/21/20 1236  BP: (!) 148/105  Pulse: (!) 101  Resp: 18  SpO2: 100%  Weight: 206 lb 12.8 oz (93.8 kg)  Height: 5\' 8"  (1.727 m)   Wt Readings from Last 3 Encounters:  01/21/20 206 lb 12.8 oz (93.8 kg)  12/28/19 192 lb 9.6 oz (87.4 kg)  12/01/19 195 lb 1.6 oz (88.5 kg)   Lab Results  Component Value Date   CREATININE 1.28 (H) 12/01/2019   CREATININE 1.06 11/30/2019   CREATININE 1.15 11/29/2019     Physical Exam Vitals and nursing note reviewed.  Constitutional:      Appearance: Normal appearance.  HENT:     Head: Normocephalic and atraumatic.  Cardiovascular:     Rate and Rhythm: Normal rate and regular rhythm.  Pulmonary:     Effort: Pulmonary effort is normal. No respiratory distress.     Breath sounds: No wheezing.  Abdominal:     General: There is distension.     Palpations: Abdomen is soft.  Musculoskeletal:        General: No tenderness.     Cervical back: Normal range of motion and neck supple.     Right lower leg: No tenderness. Edema (2+ pitting lower leg) present.     Left lower leg: No tenderness. Edema (2+ pitting lower leg) present.  Skin:    General: Skin is warm and dry.  Neurological:     General: No focal deficit present.     Mental Status: He is alert and oriented to person, place, and time.  Psychiatric:        Mood and Affect: Mood normal.        Behavior: Behavior normal.        Thought Content: Thought content normal.      Assessment & Plan:   1: Acute on Chronic heart failure with reduced ejection fraction- - NYHA class III - moderately fluid overloaded today - weighing daily and says that his weight has risen; reminded to call for an overnight weight gain of >2 pounds or a weekly weight gain of >5 pounds - weight up 14 pounds from last visit here ~ 1 month ago - will send for 80mg  IV lasix/ 14/06/2019 PO potassium today - BMP/BNP to be drawn today - resume carvedilol, furosemide and spironolactone as he says that he can pick those up at [facility] - 2 weeks samples of entresto 24/26mg  given to patient for him to take 1 tablet BID; awaiting on [employer] for assistance - not adding salt and trying to look at food labels; reviewed the importance of following a low sodium diet - BNP 11/29/2019 was 1995.0 - he's unsure if he's received the flu vaccine or not  2: HTN- - BP elevated but he hasn't been taking any of his medications for a few weeks  -  BMP 12/01/2019 reviewed and showed sodium 139, potassium 3.5, creatinine 1.28 and GFR >60  3: Tobacco use-  - smokes 1/2 ppd of cigarettes and "occasionally" uses amphetamines - complete cessation of both discussed for 3 minutes with him  Medication list was reviewed.   Return in 4 days or sooner for any questions/problems before then.

## 2020-01-25 ENCOUNTER — Ambulatory Visit: Payer: Self-pay | Admitting: Family

## 2020-01-25 ENCOUNTER — Telehealth: Payer: Self-pay | Admitting: Family

## 2020-01-25 NOTE — Telephone Encounter (Signed)
Patient did not show for his [facility] appointment on 01/25/20. Will attempt to reschedule.

## 2020-01-26 ENCOUNTER — Ambulatory Visit: Payer: Self-pay | Admitting: Family

## 2020-01-27 ENCOUNTER — Telehealth: Payer: Self-pay | Admitting: Family

## 2020-01-27 NOTE — Telephone Encounter (Signed)
Called patient to notify him of his [employer] application approval for entresto that is good till feb 2022 and LVM for him to call with any questions.     Deetta Perla, [place]

## 2020-03-15 ENCOUNTER — Telehealth: Payer: Self-pay | Admitting: Pharmacy Technician

## 2020-03-15 NOTE — Telephone Encounter (Signed)
Patient failed to provide 2021 proof of income.  No additional medication assistance will be provided by [facility] without the required proof of income documentation.  Patient notified by letter.  Deaglan Lile J. Andera Cranmer Care Manager [facility]   [address] [place], [place]  [address]     This is to inform you that you are no longer eligible to receive medication assistance at [facility].  The reason(s) are:    _____Your total gross monthly household income exceeds 250% of the Federal Poverty Level.   _____Tangible assets (savings, checking, stocks/bonds, pension, retirement, etc.) exceeds our limit  _____You are eligible to receive benefits from Medicaid, [facility] or HIV Medication              Assistance Program _____You are eligible to receive benefits from a Medicare Part "D" plan _____You have prescription insurance  _____You are not an [place] resident __X__Failure to provide all requested proof of income information for 2021.    Medication assistance will resume once all requested financial information has been returned to our clinic.  If you have questions, please contact our clinic at [phone].    Thank you,  [facility] 

## 2020-11-22 DEATH — deceased

## 2021-08-14 IMAGING — US US ABDOMEN LIMITED
1 series · 14 of 25 positions shown · non-contrast
Comparison: February 07, 2013.

CLINICAL DATA: Right upper quadrant abdominal pain.

EXAM:
ULTRASOUND ABDOMEN LIMITED RIGHT UPPER QUADRANT

[Series 1: us abdomen limited · 14 of 48 slices shown]
[im 1/48]
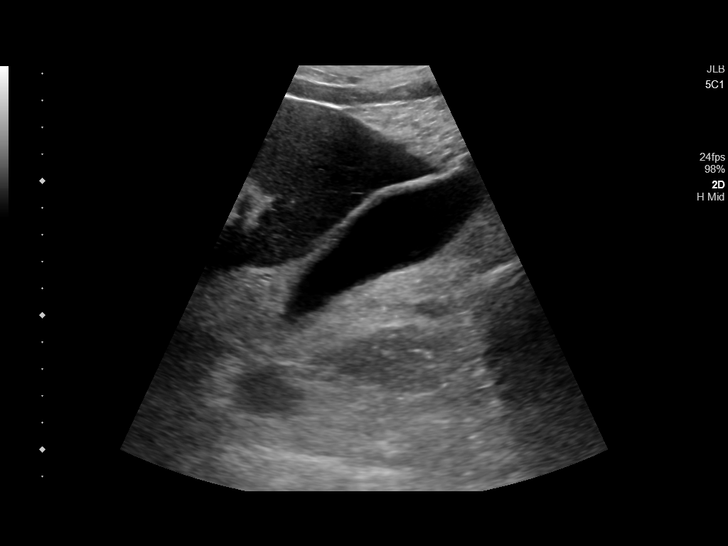
[im 4/48]
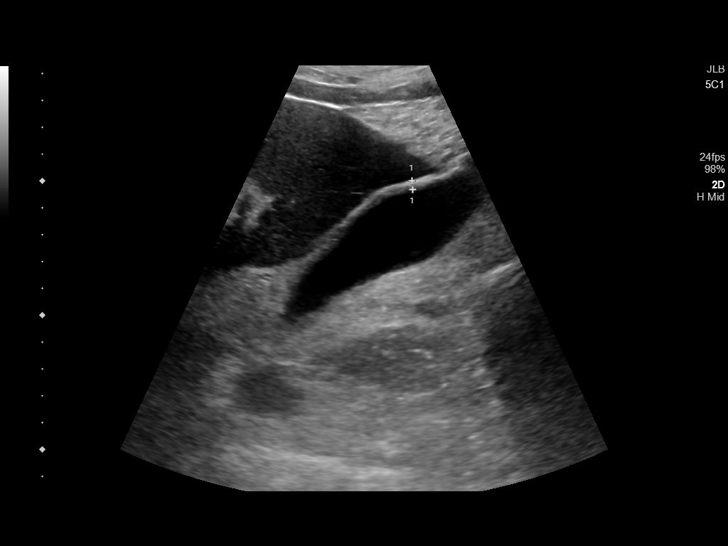
[im 8/48]
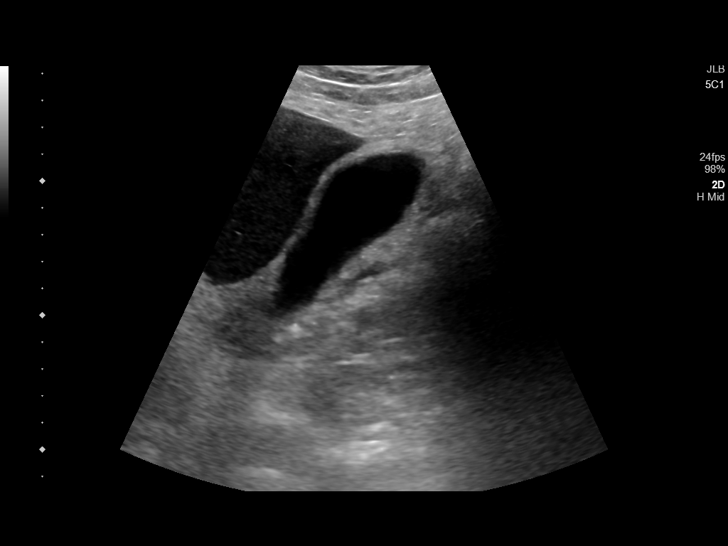
[im 12/48]
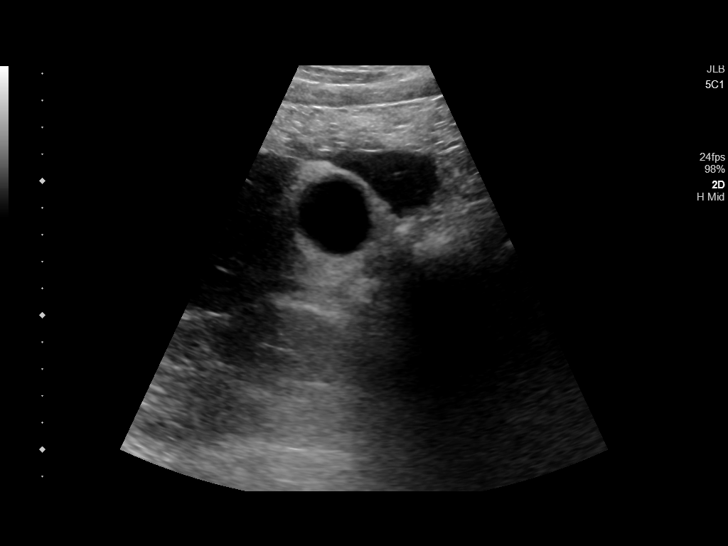
[im 16/48]
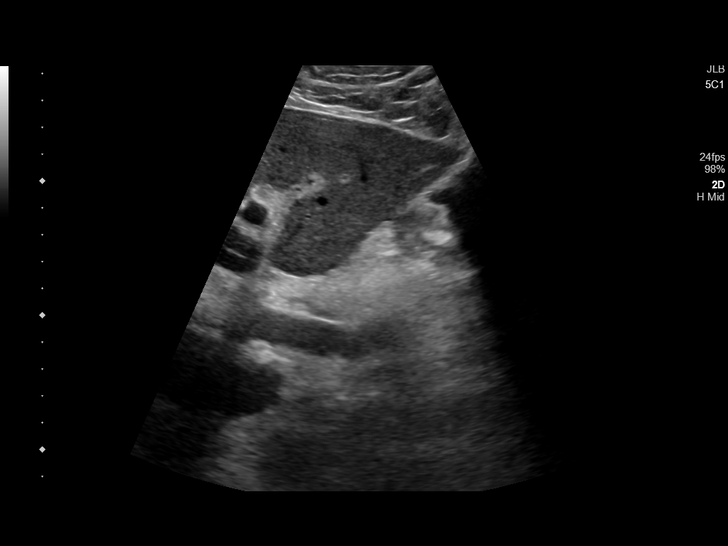
[im 18/48]
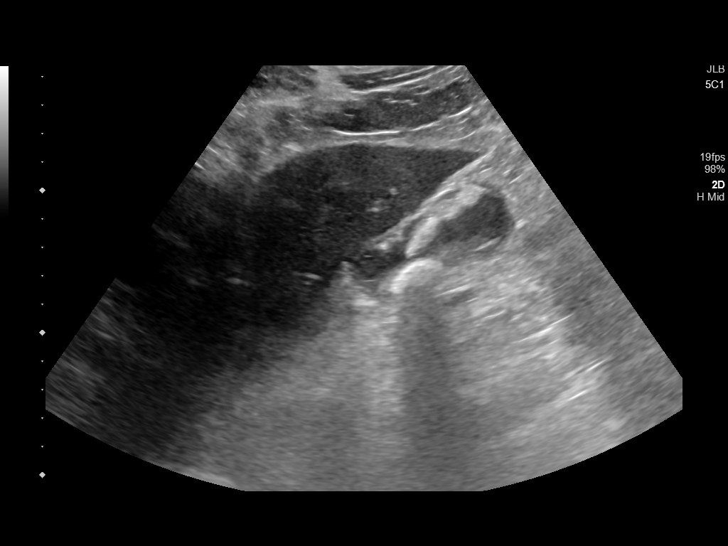
[im 22/48]
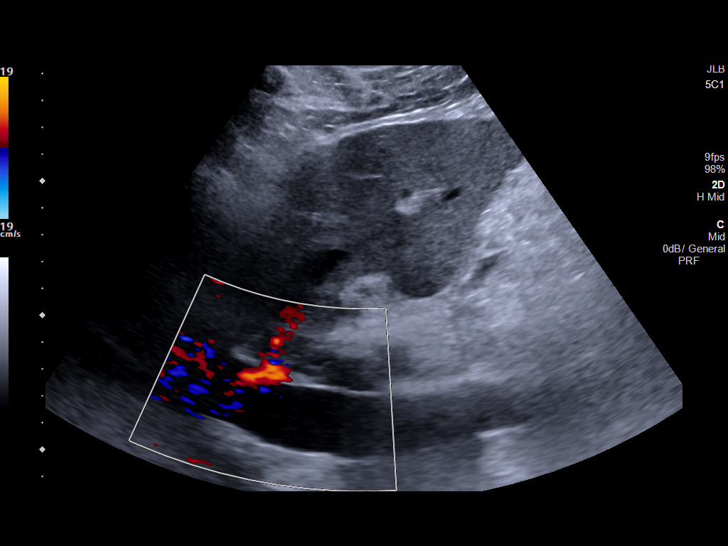
[im 26/48]
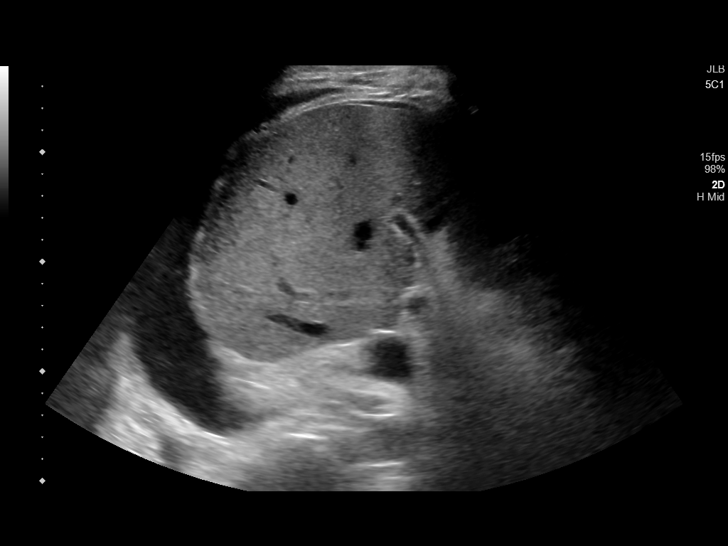
[im 30/48]
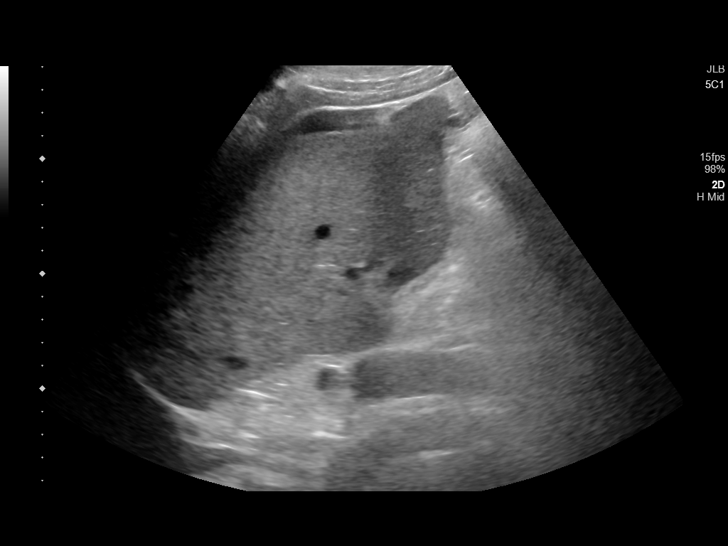
[im 32/48]
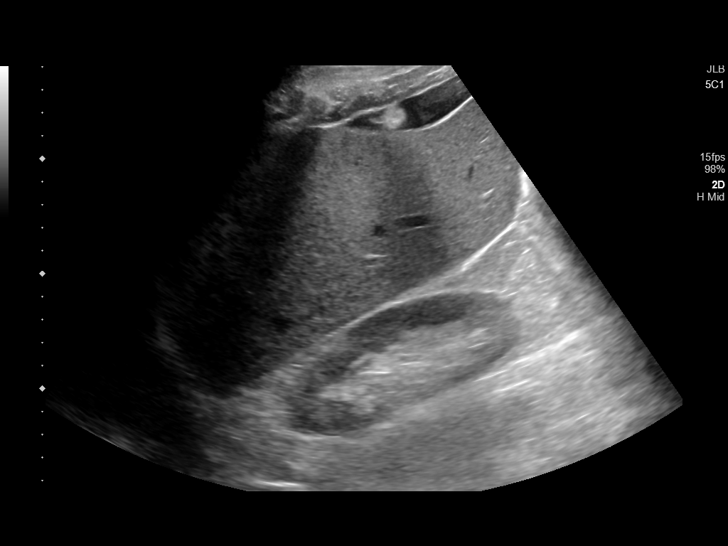
[im 36/48]
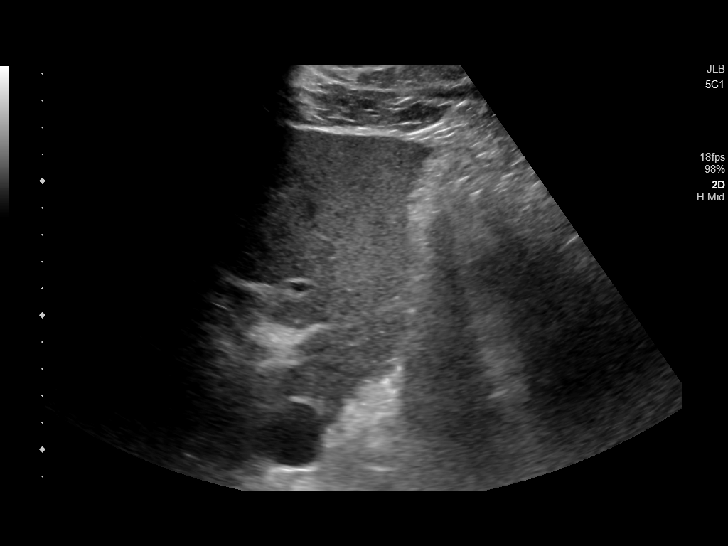
[im 40/48]
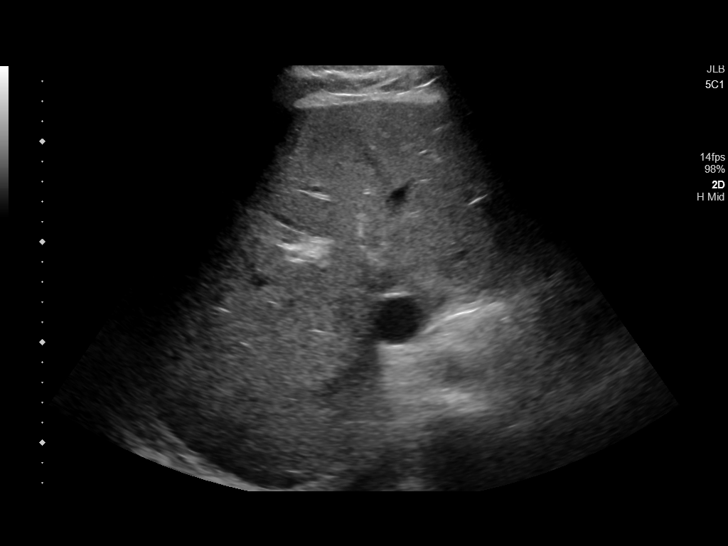
[im 44/48]
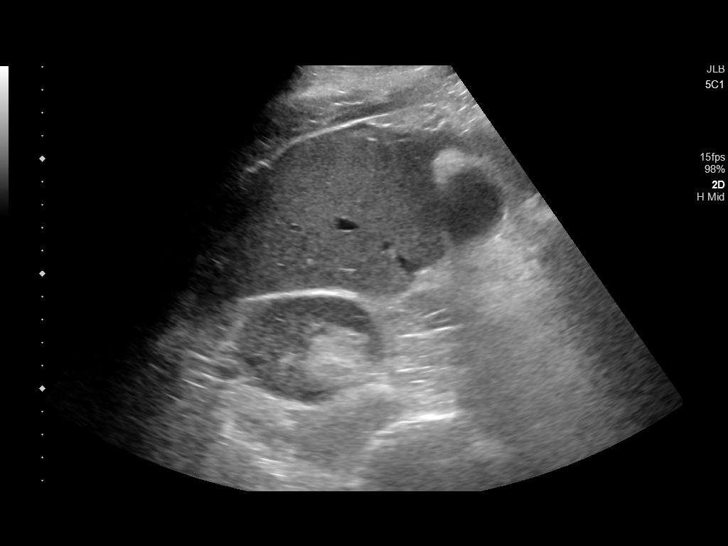
[im 48/48]
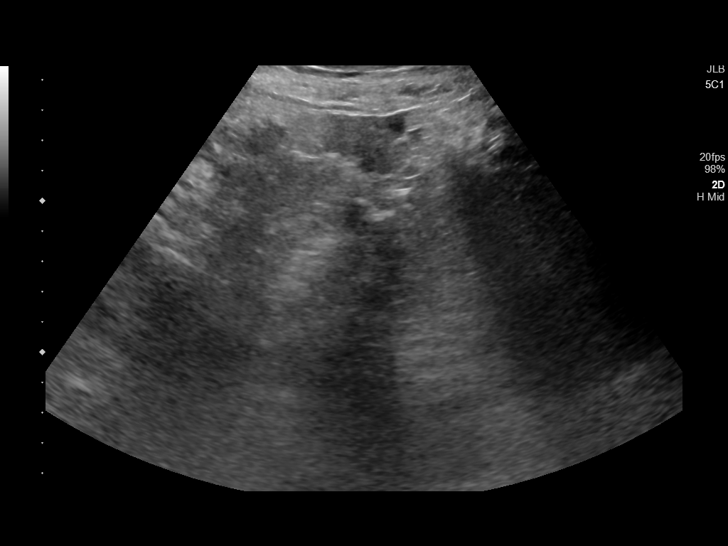

[14 of 25 positions shown; findings below may reference images not displayed]

FINDINGS: Gallbladder:

No gallstones or wall thickening visualized. No sonographic Murphy
sign noted by sonographer.

Common bile duct:

Diameter: 2 mm which is within normal limits.

Liver:

No focal lesion identified. Within normal limits in parenchymal
echogenicity. Portal vein is patent on color Doppler imaging with
normal direction of blood flow towards the liver.

Other: Minimal ascites is noted.
IMPRESSION: Minimal ascites. No other significant abnormality seen in the right
upper quadrant of the abdomen.

## 2021-08-14 IMAGING — CR DG CHEST 2V
1 series · 2 of 2 positions shown · non-contrast
Comparison: None.

CLINICAL DATA: Mild epigastric pain and shortness of breath 1 week.

EXAM:
CHEST - 2 VIEW

[Series 1: dg chest 2 view · 0.14mm/px · 2 of 2 slices shown]
[im 1/2]
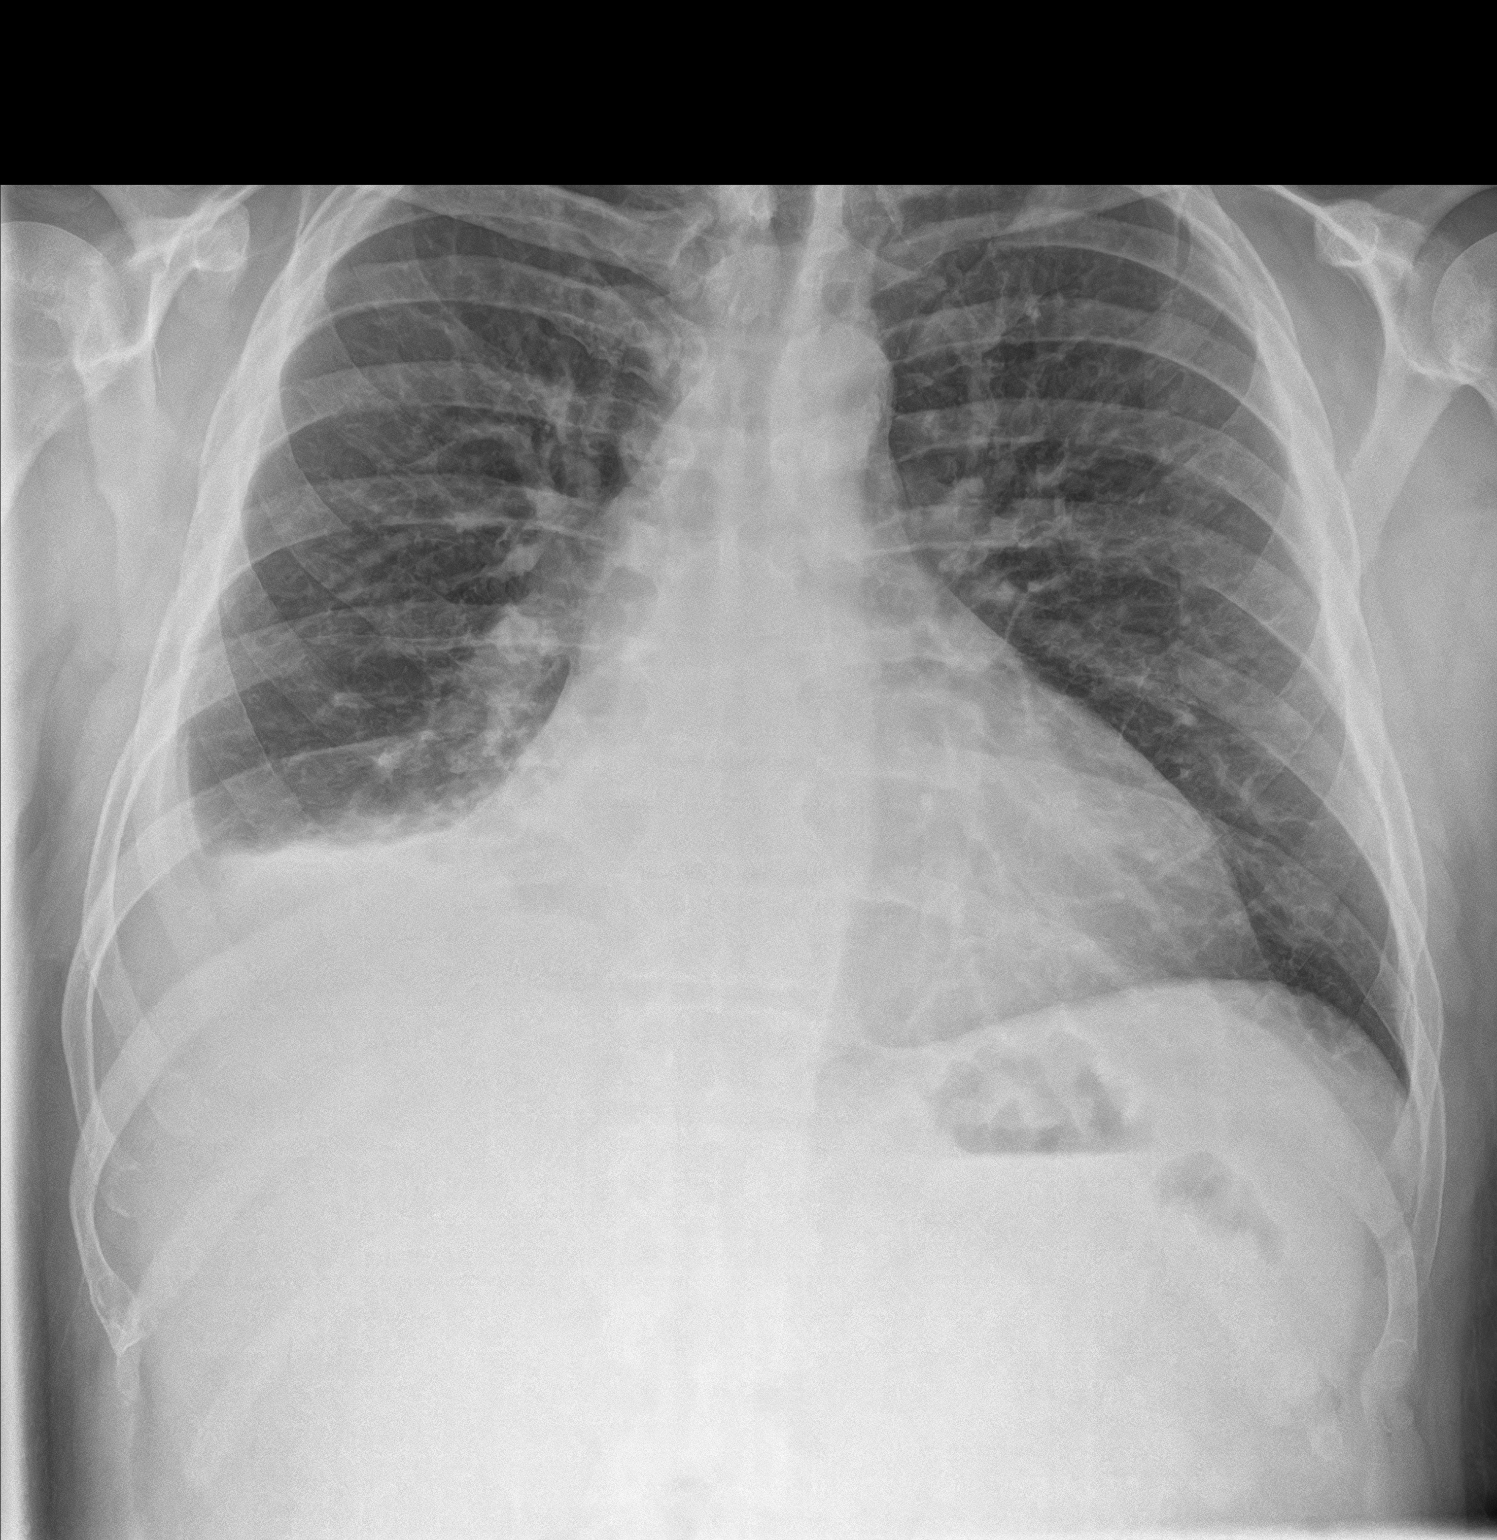
[im 2/2]
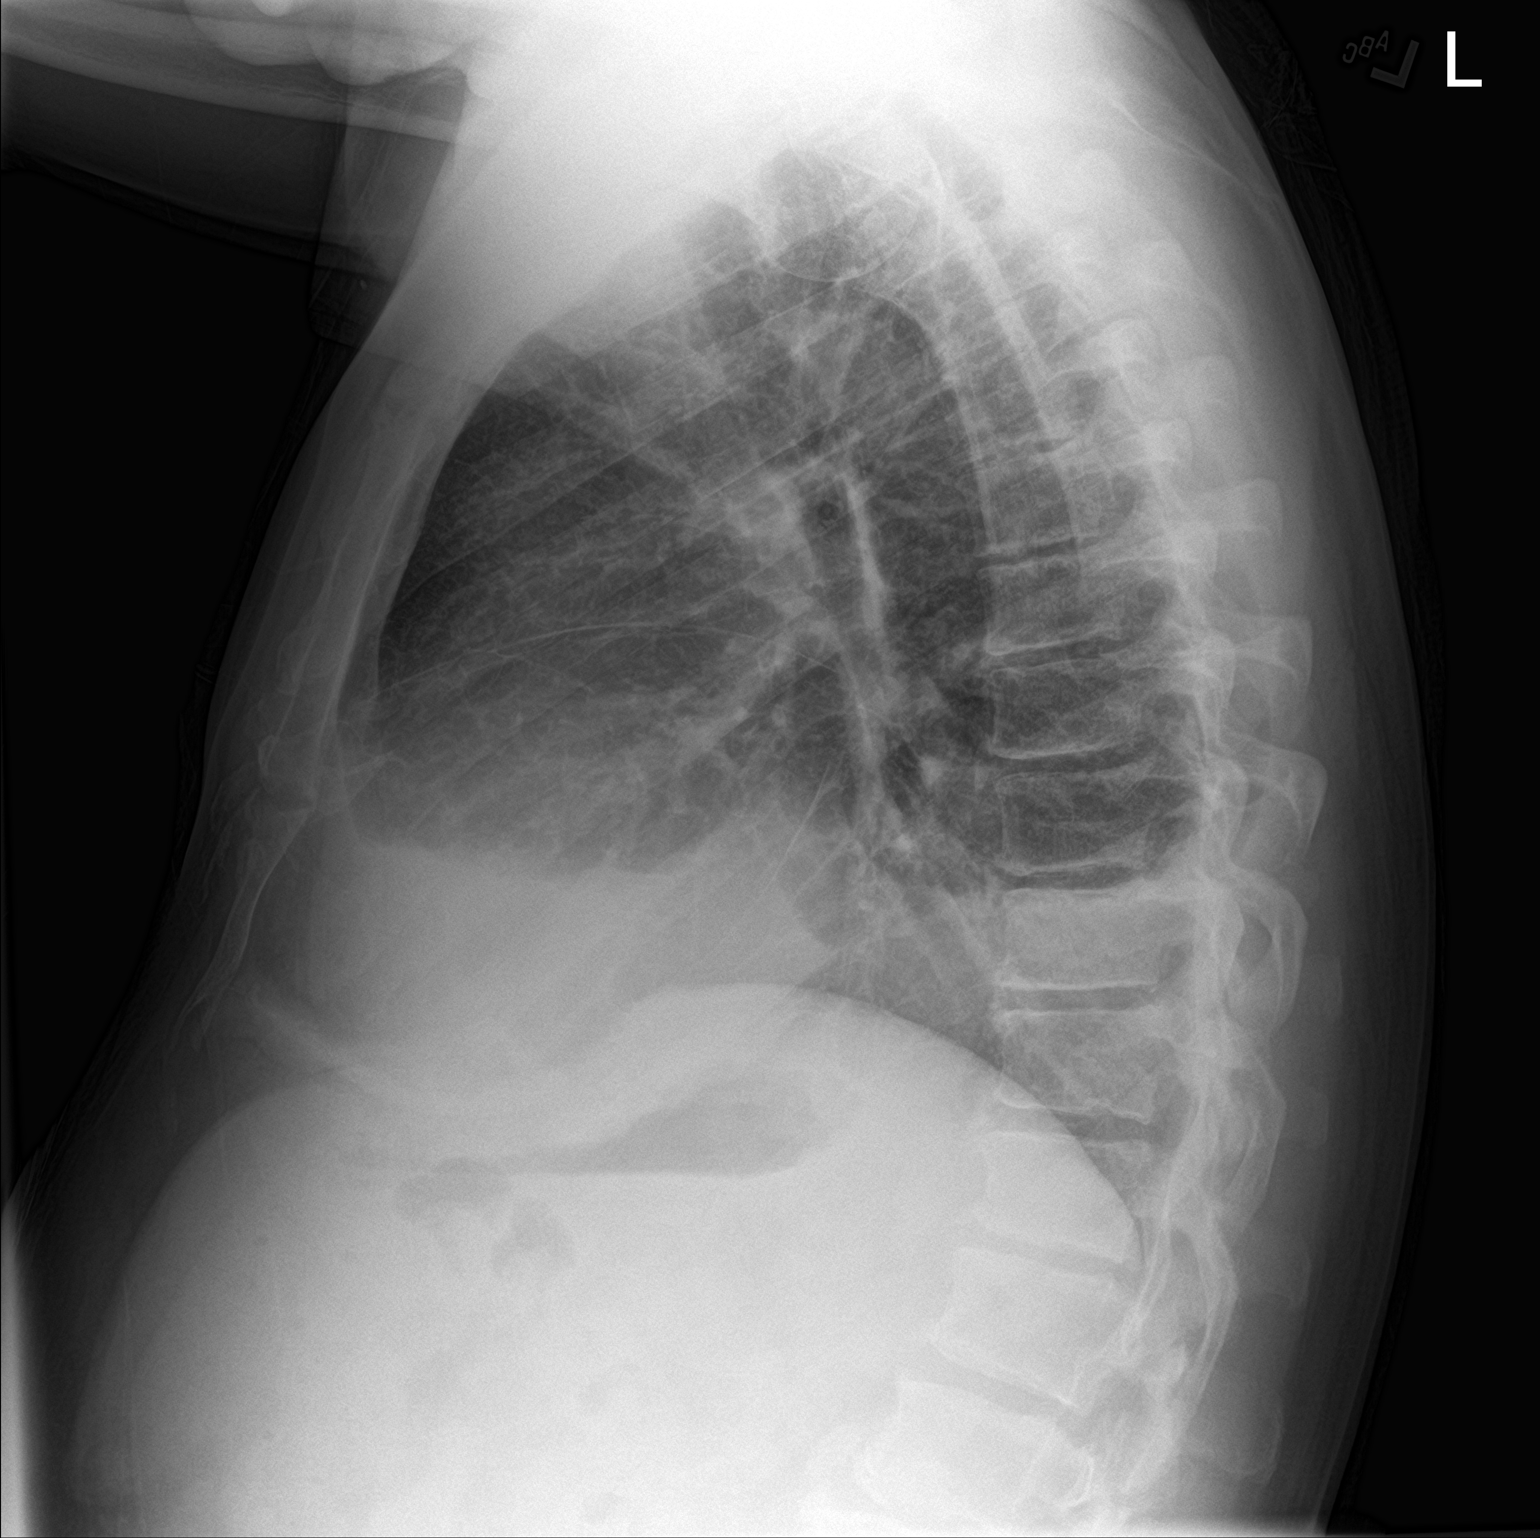

[2 of 2 positions shown; findings below may reference images not displayed]

FINDINGS: Lungs are adequately inflated demonstrate a small to moderate size
right pleural effusion likely with associated basilar atelectasis.
Left lung is clear. Mild cardiomegaly. Mild degenerate change of the
spine.
IMPRESSION: Moderate size right pleural effusion likely with associated basilar
atelectasis.

Mild cardiomegaly.
